# Patient Record
Sex: Female | Born: 1955 | Race: White | Hispanic: No | Marital: Married | Smoking: Never smoker
Health system: Southern US, Community
[De-identification: ages and names within clinical notes are randomized; demographics above are authoritative.]

## PROBLEM LIST (undated history)

## (undated) DIAGNOSIS — R053 Chronic cough: Secondary | ICD-10-CM

## (undated) DIAGNOSIS — M5430 Sciatica, unspecified side: Secondary | ICD-10-CM

## (undated) HISTORY — PX: ABDOMINAL HYSTERECTOMY: SHX81

## (undated) HISTORY — PX: HAND CARPECTOMY: SHX972

## (undated) HISTORY — PX: NASAL SINUS SURGERY: SHX719

---

## 2021-02-17 ENCOUNTER — Encounter: Payer: Self-pay | Admitting: Podiatry

## 2021-02-23 ENCOUNTER — Other Ambulatory Visit (INDEPENDENT_AMBULATORY_CARE_PROVIDER_SITE_OTHER): Payer: Self-pay | Admitting: Podiatry

## 2021-02-23 DIAGNOSIS — I70209 Unspecified atherosclerosis of native arteries of extremities, unspecified extremity: Secondary | ICD-10-CM

## 2021-02-24 ENCOUNTER — Ambulatory Visit (INDEPENDENT_AMBULATORY_CARE_PROVIDER_SITE_OTHER): Payer: Commercial Managed Care - PPO

## 2021-02-24 ENCOUNTER — Encounter (INDEPENDENT_AMBULATORY_CARE_PROVIDER_SITE_OTHER): Payer: Self-pay | Admitting: Nurse Practitioner

## 2021-02-24 ENCOUNTER — Other Ambulatory Visit: Payer: Self-pay

## 2021-02-24 ENCOUNTER — Ambulatory Visit (INDEPENDENT_AMBULATORY_CARE_PROVIDER_SITE_OTHER): Payer: Commercial Managed Care - PPO | Admitting: Nurse Practitioner

## 2021-02-24 VITALS — BP 111/67 | HR 59 | Resp 16 | Ht 64.0 in | Wt 125.8 lb

## 2021-02-24 DIAGNOSIS — R0989 Other specified symptoms and signs involving the circulatory and respiratory systems: Secondary | ICD-10-CM | POA: Diagnosis not present

## 2021-02-24 DIAGNOSIS — I70209 Unspecified atherosclerosis of native arteries of extremities, unspecified extremity: Secondary | ICD-10-CM

## 2021-02-25 ENCOUNTER — Other Ambulatory Visit: Payer: Self-pay | Admitting: Podiatry

## 2021-03-03 ENCOUNTER — Encounter (INDEPENDENT_AMBULATORY_CARE_PROVIDER_SITE_OTHER): Payer: Self-pay | Admitting: Nurse Practitioner

## 2021-03-03 NOTE — Progress Notes (Signed)
Subjective:    Patient ID: Nicole Choi, female    DOB: Feb 10, 1956, 65 y.o.   MRN: [id number] Chief Complaint  Patient presents with  . New Patient (Initial Visit)    Ref consult non-palpable pulses.pre-procedure    Nicole Choi is a 65 year old female that presents today for evaluation by Dr. Excell Seltzer due to an upcoming surgery.  On physical exam was unable to palpate pedal pulses and there was concern for PAD and possible issues with wound healing from surgery.  The patient denies any claudication or rest pain.  There are no wounds or ulcerations.  Today noninvasive studies show an ABI of 1.27 on the right and 1.28 on the left.  The patient has a TBI 0.79 on the right and 0.74 on the left the patient has good strong triphasic tibial artery waveforms bilaterally with good toe waveforms bilaterally.  Review of Systems  Cardiovascular:        No claudication or rest pain  All other systems reviewed and are negative.     Objective:   Physical Exam Vitals reviewed.  HENT:     Head: Normocephalic.  Cardiovascular:     Rate and Rhythm: Normal rate.     Pulses:          Dorsalis pedis pulses are 1+ on the right side and 1+ on the left side.       Posterior tibial pulses are 1+ on the right side and 1+ on the left side.  Pulmonary:     Effort: Pulmonary effort is normal.  Skin:    General: Skin is warm and dry.  Neurological:     Mental Status: She is alert and oriented to person, place, and time.  Psychiatric:        Mood and Affect: Mood normal.        Behavior: Behavior normal.        Thought Content: Thought content normal.        Judgment: Judgment normal.    BP 111/67 (BP Location: Right Arm)   Pulse (!) 59   Resp 16   Ht 5\' 4"  (1.626 m)   Wt 125 lb 12.8 oz (57.1 kg)   BMI 21.59 kg/m   Past Medical History:  Diagnosis Date  . Chronic cough   . Sciatic leg pain     Social History   Socioeconomic History  . Marital status: Married    Spouse name: Not on  file  . Number of children: Not on file  . Years of education: Not on file  . Highest education level: Not on file  Occupational History  . Not on file  Tobacco Use  . Smoking status: Never  . Smokeless tobacco: Never  Substance and Sexual Activity  . Alcohol use: Never  . Drug use: Never  . Sexual activity: Not on file  Other Topics Concern  . Not on file  Social History Narrative  . Not on file   Social Determinants of Health   Financial Resource Strain: Not on file  Food Insecurity: Not on file  Transportation Needs: Not on file  Physical Activity: Not on file  Stress: Not on file  Social Connections: Not on file  Intimate Partner Violence: Not on file    Past Surgical History:  Procedure Laterality Date  . ABDOMINAL HYSTERECTOMY    . HAND CARPECTOMY Bilateral   . NASAL SINUS SURGERY      Family History  Problem Relation Age of Onset  .  Hypertension Mother   . Thyroid disease Mother   . Cancer Father   . Varicose Veins Father   . Varicose Veins Sister   . Thyroid cancer Sister     Allergies  Allergen Reactions  . Levaquin [Levofloxacin] Swelling    No flowsheet data found.    CMP  No results found for: NA, K, CL, CO2, GLUCOSE, BUN, CREATININE, CALCIUM, PROT, ALBUMIN, AST, ALT, ALKPHOS, BILITOT, GFRNONAA, GFRAA   VAS [place] ABI WITH/WO TBI  Result Date: 02/24/2021  LOWER EXTREMITY DOPPLER STUDY Patient Name:  Nicole Choi  Date of Exam:   02/24/2021 Medical Rec #: [id number]        Accession #:    [id number] Date of Birth: 10/23/1955        Patient Gender: F Patient Age:   4 years Exam Location:  [facility] Procedure:      VAS [place] ABI WITH/WO TBI Referring Phys: Rosetta Posner --------------------------------------------------------------------------------  Indications: Non palpable pulses felt at the level of the Ankle.  Performing Technologist: Debbe Bales RVS  Examination Guidelines: A complete evaluation includes at minimum, Doppler  waveform signals and systolic blood pressure reading at the level of bilateral brachial, anterior tibial, and posterior tibial arteries, when vessel segments are accessible. Bilateral testing is considered an integral part of a complete examination. Photoelectric Plethysmograph (PPG) waveforms and toe systolic pressure readings are included as required and additional duplex testing as needed. Limited examinations for reoccurring indications may be performed as noted.  ABI Findings: +---------+------------------+-----+---------+--------+ Right    Rt Pressure (mmHg)IndexWaveform Comment  +---------+------------------+-----+---------+--------+ Brachial 118                                      +---------+------------------+-----+---------+--------+ ATA      138                    triphasic1.14     +---------+------------------+-----+---------+--------+ PTA      154               1.27 triphasic         +---------+------------------+-----+---------+--------+ Great Toe96                0.79 Normal            +---------+------------------+-----+---------+--------+ +---------+------------------+-----+---------+-------+ Left     Lt Pressure (mmHg)IndexWaveform Comment +---------+------------------+-----+---------+-------+ Brachial 121                                     +---------+------------------+-----+---------+-------+ ATA      140                    triphasic1.16    +---------+------------------+-----+---------+-------+ PTA      155               1.28 triphasic        +---------+------------------+-----+---------+-------+ Great Toe89                0.74 Normal           +---------+------------------+-----+---------+-------+ +-------+-----------+-----------+------------+------------+ ABI/TBIToday's ABIToday's TBIPrevious ABIPrevious TBI +-------+-----------+-----------+------------+------------+ Right  1.27       .79                                  +-------+-----------+-----------+------------+------------+  Left   1.28       .74                                 +-------+-----------+-----------+------------+------------+  Summary: Right: Resting right ankle-brachial index is within normal range. No evidence of significant right lower extremity arterial disease. The right toe-brachial index is normal. Left: Resting left ankle-brachial index is within normal range. No evidence of significant left lower extremity arterial disease. The left toe-brachial index is normal.  *See table(s) above for measurements and observations.  Electronically signed by Festus Barren MD on 02/24/2021 at 2:51:47 PM.    Final        Assessment & Plan:   1. Diminished pulses in lower extremity Based on noninvasive studies today the patient has slightly elevated ABIs which may be some evidence of arterial hardening which can explain her hard to palpate pulses.  However noninvasive studies show that the patient has essentially normal blood flow and healing postsurgically should not have any issues.  The patient will follow-up with [place] on an as-needed basis.   Current Outpatient Medications on File Prior to Visit  Medication Sig Dispense Refill  . amitriptyline (ELAVIL) 10 MG tablet Take 20 mg by mouth at bedtime.     No current facility-administered medications on file prior to visit.    There are no Patient Instructions on file for this visit. No follow-ups on file.   Georgiana Spinner, NP

## 2021-03-03 NOTE — Discharge Instructions (Addendum)
[facility]  POST OPERATIVE INSTRUCTIONS FOR DR. Ether Griffins AND DR. Kinza Gouveia [facility] CLIN[facility] PODIATRY DEPARTMENT   Take your medication as prescribed.  Pain medication should be taken only as needed.  You may take pain medication once every 6 hours.  If pain worsens try taking ibuprofen or Tylenol between doses to see if this will help.  If pain is still severe and out of control then would recommend taking 1 pain tablet every 4 hours.  If pain is still out of control after that then try taking 2 pain tablets every 4 hours.  Once again try to take pain medication though as prescribed.  Keep the dressing clean, dry and intact.  Continue nonweightbearing at all times to the left lower extremity.  Keep your foot elevated above the heart level for the first 48 hours.  Continue elevation thereafter for further swelling control.  You may also put ice to the anterior aspect of your ankle/top of the foot for maximum 10 minutes out of every 1 hour to help with swelling.  Take antibiotics as prescribed until gone.  Also start taking aspirin 81 mg twice daily starting tomorrow.  Always use crutches, knee scooter, or wheelchair to get around.  Do not take a shower. Baths are permissible as long as the foot is kept out of the water.  Do not get dressing wet.  Every hour you are awake:  Bend your knee 15 times. Massage calf 15 times  Call [facility] [phone]) if any of the following problems occur: You develop a temperature or fever. The bandage becomes saturated with blood. Medication does not stop your pain. Injury of the foot occurs. Any symptoms of infection including redness, odor, or red streaks running from wound.    PERIPHERAL NERVE BLOCK PATIENT INFORMATION  Your surgeon has requested a peripheral nerve block for your surgery. This anesthetic technique provides excellent post-operative pain relief for you in a safe and effective manner. It  will also help reduce the risk of nausea and vomiting and allow earlier discharge from the hospital.   The block is performed under sedation with ultrasound guidance prior to your procedure. Due to the sedation, your may or may not remember the block experience. The nerve block will begin to take effect anywhere from 5 to 30 minutes after being administered. You will be transported to the operating room from your surgery after the block is completed.   At the end of surgery, when the anesthesia wears off, you will notice a few things. Your may not be able to move or feel the part of your body targeted by the nerve block. These are normal experiences, and they will disappear as the block wears off.  If you had an interscalene nerve block performed (which is common for shoulder surgery), your voice can be very hoarse and you may feel that you are not able to take as deep a breath as you did before surgery. Some patients may also notice a droopy eyelid on the affected side. These symptoms will resolve once the block wears off.  Pain control: The nerve block technique used is a single injection that can last anywhere from 1-3 days. The duration of the numbness can vary between individuals. After leaving the hospital, it is important that you begin to take your prescribed pain medication when you start to sense the nerve block wearing off. This will help you avoid unpleasant pain at the time the nerve block wears off,  which can sometimes be in the middle of the night. The block will only cover pain in the areas targeted by the nerve block so if you experience surgical pain outside of that area, please take your prescribed pain medication. Management of the "numb area": After a nerve block, you cannot feel pain, pressure, or temperature in the affected area so there is an increased risk for injury. You should take extra care to protect the affected areas until sensation and movement returns. Please take caution to  not come in contact with extremely hot or cold items because you will not be able to sense or protect yourself form the extremes of temperature.  You may experience some persistent numbness after the procedure by most neurological deficits resolve over time and the incidence of serious long term neurological complications attributable to peripheral nerve blocks are relatively uncommon.     Information for Discharge Teaching: EXPAREL (bupivacaine liposome injectable suspension)   Your surgeon or anesthesiologist gave you EXPAREL(bupivacaine) to help control your pain after surgery.  EXPAREL is a local anesthetic that provides pain relief by numbing the tissue around the surgical site. EXPAREL is designed to release pain medication over time and can control pain for up to 72 hours. Depending on how you respond to EXPAREL, you may require less pain medication during your recovery.  Possible side effects: Temporary loss of sensation or ability to move in the area where bupivacaine was injected. Nausea, vomiting, constipation Rarely, numbness and tingling in your mouth or lips, lightheadedness, or anxiety may occur. Call your doctor right away if you think you may be experiencing any of these sensations, or if you have other questions regarding possible side effects.  Follow all other discharge instructions given to you by your surgeon or nurse. Eat a healthy diet and drink plenty of water or other fluids.  If you return to the hospital for any reason within 96 hours following the administration of EXPAREL, it is important for health care providers to know that you have received this anesthetic. A teal colored band has been placed on your arm with the date, time and amount of EXPAREL you have received in order to alert and inform your health care providers. Please leave this armband in place for the full 96 hours following administration, and then you may remove the band.

## 2021-03-05 ENCOUNTER — Other Ambulatory Visit: Payer: Self-pay

## 2021-03-05 ENCOUNTER — Encounter
Admission: RE | Disposition: A | Discharge status: Home / Self Care | Payer: Self-pay | Source: Ambulatory Visit | Attending: Podiatry

## 2021-03-05 ENCOUNTER — Ambulatory Visit: Payer: Commercial Managed Care - PPO | Admitting: Anesthesiology

## 2021-03-05 ENCOUNTER — Ambulatory Visit
Admission: RE | Admit: 2021-03-05 | Discharge: 2021-03-05 | Disposition: A | Discharge status: Home / Self Care | Payer: Commercial Managed Care - PPO | Source: Ambulatory Visit | Attending: Podiatry | Admitting: Podiatry

## 2021-03-05 ENCOUNTER — Encounter: Payer: Self-pay | Admitting: Podiatry

## 2021-03-05 DIAGNOSIS — M21621 Bunionette of right foot: Secondary | ICD-10-CM | POA: Insufficient documentation

## 2021-03-05 DIAGNOSIS — Z09 Encounter for follow-up examination after completed treatment for conditions other than malignant neoplasm: Secondary | ICD-10-CM

## 2021-03-05 DIAGNOSIS — Z881 Allergy status to other antibiotic agents status: Secondary | ICD-10-CM | POA: Insufficient documentation

## 2021-03-05 DIAGNOSIS — M2011 Hallux valgus (acquired), right foot: Secondary | ICD-10-CM | POA: Insufficient documentation

## 2021-03-05 DIAGNOSIS — M2041 Other hammer toe(s) (acquired), right foot: Secondary | ICD-10-CM | POA: Diagnosis not present

## 2021-03-05 DIAGNOSIS — Z79899 Other long term (current) drug therapy: Secondary | ICD-10-CM | POA: Diagnosis not present

## 2021-03-05 HISTORY — PX: OSTECTOMY: SHX6439

## 2021-03-05 HISTORY — DX: Chronic cough: R05.3

## 2021-03-05 HISTORY — DX: Sciatica, unspecified side: M54.30

## 2021-03-05 HISTORY — PX: CORRECTION OVERLAPPING TOES: SHX6615

## 2021-03-05 HISTORY — PX: CAPSULOTOMY: SHX379

## 2021-03-05 HISTORY — PX: BUNIONECTOMY: SHX129

## 2021-03-05 HISTORY — PX: WEIL OSTEOTOMY: SHX5044

## 2021-03-05 SURGERY — BUNIONECTOMY
Anesthesia: Regional | Site: Toe | Laterality: Right

## 2021-03-05 MED ORDER — EPHEDRINE SULFATE 50 MG/ML IJ SOLN
INTRAMUSCULAR | Status: DC | PRN
  Administered 2021-03-05: 10 mg via INTRAVENOUS

## 2021-03-05 MED ORDER — LACTATED RINGERS IV SOLN: INTRAVENOUS | Status: DC

## 2021-03-05 MED ORDER — OXYCODONE HCL 5 MG PO TABS: 5.0000 mg | ORAL_TABLET | Freq: Once | ORAL | Status: DC | PRN

## 2021-03-05 MED ORDER — LIDOCAINE HCL (CARDIAC) PF 100 MG/5ML IV SOSY
PREFILLED_SYRINGE | INTRAVENOUS | Status: DC | PRN
  Administered 2021-03-05: 30 mg via INTRATRACHEAL

## 2021-03-05 MED ORDER — PROPOFOL 10 MG/ML IV BOLUS
INTRAVENOUS | Status: DC | PRN
  Administered 2021-03-05: 130 mg via INTRAVENOUS

## 2021-03-05 MED ORDER — DEXAMETHASONE SODIUM PHOSPHATE 4 MG/ML IJ SOLN
INTRAMUSCULAR | Status: DC | PRN
  Administered 2021-03-05: 4 mg via INTRAVENOUS

## 2021-03-05 MED ORDER — ACETAMINOPHEN 325 MG PO TABS: 325.0000 mg | ORAL_TABLET | ORAL | Status: DC | PRN

## 2021-03-05 MED ORDER — BUPIVACAINE HCL (PF) 0.5 % IJ SOLN
INTRAMUSCULAR | Status: DC | PRN
  Administered 2021-03-05: 20 mL via PERINEURAL

## 2021-03-05 MED ORDER — ONDANSETRON HCL 4 MG/2ML IJ SOLN
INTRAMUSCULAR | Status: DC | PRN
  Administered 2021-03-05: 4 mg via INTRAVENOUS

## 2021-03-05 MED ORDER — ACETAMINOPHEN 160 MG/5ML PO SOLN: 325.0000 mg | ORAL | Status: DC | PRN

## 2021-03-05 MED ORDER — OXYCODONE HCL 5 MG/5ML PO SOLN: 5.0000 mg | Freq: Once | ORAL | Status: DC | PRN

## 2021-03-05 MED ORDER — POVIDONE-IODINE 7.5 % EX SOLN: Freq: Once | CUTANEOUS | Status: AC

## 2021-03-05 MED ORDER — MIDAZOLAM HCL 5 MG/5ML IJ SOLN
INTRAMUSCULAR | Status: DC | PRN
  Administered 2021-03-05: 2 mg via INTRAVENOUS

## 2021-03-05 MED ORDER — 0.9 % SODIUM CHLORIDE (POUR BTL) OPTIME
TOPICAL | Status: DC | PRN
  Administered 2021-03-05: 500 mL

## 2021-03-05 MED ORDER — ASPIRIN EC 81 MG PO TBEC: 81.0000 mg | DELAYED_RELEASE_TABLET | Freq: Two times a day (BID) | ORAL | 0 refills | Status: AC

## 2021-03-05 MED ORDER — CEFAZOLIN SODIUM-DEXTROSE 2-4 GM/100ML-% IV SOLN
2.0000 g | INTRAVENOUS | Status: AC
  Administered 2021-03-05: 2 g via INTRAVENOUS

## 2021-03-05 MED ORDER — BUPIVACAINE LIPOSOME 1.3 % IJ SUSP
INTRAMUSCULAR | Status: DC | PRN
  Administered 2021-03-05: 20 mL via PERINEURAL

## 2021-03-05 MED ORDER — OXYCODONE-ACETAMINOPHEN 7.5-325 MG PO TABS: 1.0000 | ORAL_TABLET | Freq: Four times a day (QID) | ORAL | 0 refills | Status: AC | PRN

## 2021-03-05 MED ORDER — FENTANYL CITRATE PF 50 MCG/ML IJ SOSY: 25.0000 ug | PREFILLED_SYRINGE | INTRAMUSCULAR | Status: DC | PRN

## 2021-03-05 MED ORDER — FENTANYL CITRATE (PF) 100 MCG/2ML IJ SOLN
INTRAMUSCULAR | Status: DC | PRN
  Administered 2021-03-05: 100 ug via INTRAVENOUS

## 2021-03-05 MED ORDER — AMOXICILLIN-POT CLAVULANATE 875-125 MG PO TABS: 1.0000 | ORAL_TABLET | Freq: Two times a day (BID) | ORAL | 0 refills | Status: AC

## 2021-03-05 SURGICAL SUPPLY — 68 items
BIT DRILL 1.7 LNG CANN (DRILL) ×3 IMPLANT
BLADE OSC/SAGITTAL MD 5.5X18 (BLADE) ×3 IMPLANT
BLADE OSCILLATING/SAGITTAL (BLADE) ×3
BLADE SAW LAPIPLASTY 40X11 (BLADE) ×3 IMPLANT
BLADE SURG 15 STRL LF DISP TIS (BLADE) ×4 IMPLANT
BLADE SURG 15 STRL SS (BLADE) ×6
BLADE SW THK.38XMED LNG THN (BLADE) ×2 IMPLANT
BNDG CMPR 75X41 PLY HI ABS (GAUZE/BANDAGES/DRESSINGS) ×2
BNDG CMPR STD VLCR NS LF 5.8X4 (GAUZE/BANDAGES/DRESSINGS) ×2
BNDG CMPR STD VLCR NS LF 5.8X6 (GAUZE/BANDAGES/DRESSINGS) ×2
BNDG ELASTIC 4X5.8 VLCR NS LF (GAUZE/BANDAGES/DRESSINGS) ×3 IMPLANT
BNDG ELASTIC 6X5.8 VLCR NS LF (GAUZE/BANDAGES/DRESSINGS) ×3 IMPLANT
BNDG ESMARK 4X12 TAN STRL LF (GAUZE/BANDAGES/DRESSINGS) ×3 IMPLANT
BNDG GAUZE ELAST 4 BULKY (GAUZE/BANDAGES/DRESSINGS) ×3 IMPLANT
BNDG STRETCH 4X75 STRL LF (GAUZE/BANDAGES/DRESSINGS) ×3 IMPLANT
CANISTER SUCT 1200ML W/VALVE (MISCELLANEOUS) ×3 IMPLANT
CLIP FIXATION STAPLE 10X10X10 (Staple) ×3 IMPLANT
CNTRSNK DRL 2 SCR (MISCELLANEOUS) ×2 IMPLANT
COUNTERSINK 2.0 (MISCELLANEOUS) ×3
COVER LIGHT HANDLE UNIVERSAL (MISCELLANEOUS) ×6 IMPLANT
CUFF TOURN SGL QUICK 18X4 (TOURNIQUET CUFF) IMPLANT
DRAPE FLUOR MINI C-ARM 54X84 (DRAPES) ×3 IMPLANT
DURAPREP 26ML APPLICATOR (WOUND CARE) ×3 IMPLANT
ELECT REM PT RETURN 9FT ADLT (ELECTROSURGICAL) ×3
ELECTRODE REM PT RTRN 9FT ADLT (ELECTROSURGICAL) ×2 IMPLANT
GAUZE SPONGE 4X4 12PLY STRL (GAUZE/BANDAGES/DRESSINGS) ×3 IMPLANT
GAUZE XEROFORM 1X8 LF (GAUZE/BANDAGES/DRESSINGS) ×6 IMPLANT
GLOVE SURG ENC MOIS LTX SZ7 (GLOVE) ×3 IMPLANT
GLOVE SURG POLYISO LF SZ7 (GLOVE) ×3 IMPLANT
GLOVE SURG UNDER POLY LF SZ7.5 (GLOVE) ×6 IMPLANT
GOWN STRL REUS W/ TWL LRG LVL3 (GOWN DISPOSABLE) ×4 IMPLANT
GOWN STRL REUS W/ TWL XL LVL3 (GOWN DISPOSABLE) ×2 IMPLANT
GOWN STRL REUS W/TWL LRG LVL3 (GOWN DISPOSABLE) ×6
GOWN STRL REUS W/TWL XL LVL3 (GOWN DISPOSABLE) ×3
K-WIRE DBL END TROCAR 6X.045 (WIRE) ×3
KIT PROCEDURE DRILL (DRILL) ×3 IMPLANT
KIT TURNOVER KIT A (KITS) ×3 IMPLANT
KWIRE DBL END TROCAR 6X.045 (WIRE) ×2 IMPLANT
LAPIPLASTY SYS 4A (Orthopedic Implant) ×3 IMPLANT
NS IRRIG 500ML POUR BTL (IV SOLUTION) ×3 IMPLANT
PACK EXTREMITY ARMC (MISCELLANEOUS) ×3 IMPLANT
PADDING CAST BLEND 4X4 NS (MISCELLANEOUS) ×12 IMPLANT
SCREW 2.0X11 HEADED (Screw) ×3 IMPLANT
SCREW 2.7 HIGH PITCH LOCKING (Screw) ×3 IMPLANT
SCREW CANN HEAD ST 2.0X10 (Screw) ×3 IMPLANT
SCREW HEAD ST 12X2XHD NS (Screw) ×4 IMPLANT
SCREW HEADED 2.0X12 (Screw) ×6 IMPLANT
SCREW HIGH PITCH LOCK 2.7 (Screw) ×3 IMPLANT
SPLINT CAST 1 STEP 4X30 (MISCELLANEOUS) IMPLANT
STOCKINETTE STRL 6IN 960660 (GAUZE/BANDAGES/DRESSINGS) ×3 IMPLANT
SUT ETHILON 4-0 (SUTURE) ×9
SUT ETHILON 4-0 FS2 18XMFL BLK (SUTURE) ×6
SUT MNCRL 4-0 (SUTURE) ×3
SUT MNCRL 4-0 27XMFL (SUTURE) ×2
SUT MNCRL+ 5-0 UNDYED PC-3 (SUTURE) IMPLANT
SUT MONOCRYL 5-0 (SUTURE)
SUT VIC AB 0 CT1 27 (SUTURE)
SUT VIC AB 0 CT1 27XCR 8 STRN (SUTURE) IMPLANT
SUT VIC AB 2-0 SH 27 (SUTURE)
SUT VIC AB 2-0 SH 27XBRD (SUTURE) IMPLANT
SUT VIC AB 3-0 SH 27 (SUTURE) ×6
SUT VIC AB 3-0 SH 27X BRD (SUTURE) ×4 IMPLANT
SUT VIC AB 4-0 FS2 27 (SUTURE) IMPLANT
SUTURE ETHLN 4-0 FS2 18XMF BLK (SUTURE) ×6 IMPLANT
SUTURE MNCRL 4-0 27XMF (SUTURE) ×2 IMPLANT
SYSTEM LAPIPLASTY 4A (Orthopedic Implant) ×2 IMPLANT
WATER STERILE IRR 250ML POUR (IV SOLUTION) ×3 IMPLANT
WIRE SMOOTH TROCAR .9MMX150MML (WIRE) ×12 IMPLANT

## 2021-03-05 NOTE — Anesthesia Postprocedure Evaluation (Signed)
Anesthesia Post Note  Patient: Nicole Choi  Procedure(s) Performed: BUNIONECTOMY-lapidus akin (Right: Toe) OSTECTOMY-5th met head (Right) WEIL OSTEOTOMY 2nd right (Right) CORRECTION OVERLAPPING TOES  reconstruction rt 2nd (Right) CAPSULOTOMY rt 3rd mtpj (Right)     Patient location during evaluation: PACU Anesthesia Type: Regional Level of consciousness: awake Pain management: pain level controlled Vital Signs Assessment: post-procedure vital signs reviewed and stable Respiratory status: respiratory function stable Cardiovascular status: stable Postop Assessment: no signs of nausea or vomiting Anesthetic complications: no   No notable events documented.  Veda Canning

## 2021-03-05 NOTE — Anesthesia Procedure Notes (Signed)
Anesthesia Regional Block: Adductor canal block   Pre-Anesthetic Checklist: , timeout performed,  Correct Patient, Correct Site, Correct Laterality,  Correct Procedure, Correct Position, site marked,  Risks and benefits discussed,  Surgical consent,  Pre-op evaluation,  At surgeon's request and post-op pain management  Laterality: Right  Prep: chloraprep       Needles:  Injection technique: Single-shot  Needle Type: Echogenic Needle     Needle Length: 9cm  Needle Gauge: 21     Additional Needles:   Procedures:,,,, ultrasound used (permanent image in chart),,    Narrative:  Start time: 03/05/2021 8:37 AM End time: 03/05/2021 8:39 AM Injection made incrementally with aspirations every 5 mL.  Performed by: Personally  Anesthesiologist: Jola Babinski, MD  Additional Notes: Functioning IV was confirmed and monitors applied. Ultrasound guidance: relevant anatomy identified, needle position confirmed, local anesthetic spread visualized around nerve(s)., vascular puncture avoided.  Image printed for medical record.  Negative aspiration and no paresthesias; incremental administration of local anesthetic. The patient tolerated the procedure well. Vitals signs recorded in RN notes.  24mL 0.5% bupivacaine + 38mL Exparel for total of 110mL local in adductor block

## 2021-03-05 NOTE — H&P (Signed)
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  The various methods of treatment have been discussed with the patient.  No guarantees were given.  After consideration of risks, benefits and other options for treatment, the patient has consented to surgery.  I have reviewed the patients' chart and labs.     A history and physical examination was performed in my office.  The patient was reexamined.  There have been no changes to this history and physical examination.  Rosetta Posner, DPM

## 2021-03-05 NOTE — Op Note (Signed)
PODIATRY / FOOT AND ANKLE SURGERY OPERATIVE REPORT    SURGEON: Rosetta Posner, DPM  PRE-OPERATIVE DIAGNOSIS: All right foot 1.  Hallux valgus with first TMT J hypermobility and dorsiflexed first ray 2.  Second hammertoe contracture 3.  Hallux limitus 4.  Long plantarflexed second metatarsal. 5.  Extensor contractures digits 2 through 5 6.  Tailor's bunion  POST-OPERATIVE DIAGNOSIS: Same  PROCEDURE(S): All right foot Lapidus bunionectomy with Akin osteotomy Cheilectomy first metatarsal phalangeal joint with fenestration of mild cartilage damage. Second metatarsal Weil osteotomy Capsular tendon balancing second, third metatarsal phalangeal joints with extensor tendon lengthenings Tailor's bunionectomy with osteotomy  HEMOSTASIS: Right ankle tourniquet  ANESTHESIA: general  ESTIMATED BLOOD LOSS: 40 cc  FINDING(S): 1.  Cartilage damage noted to the first metatarsal head at the dorsal aspect and very plantar aspect 2.  Extensor contractures of digits 2, 3, 4, 5  PATHOLOGY/SPECIMEN(S): None  INDICATIONS:   Nicole Choi is a 65 y.o. female who presents with painful bunion to the first metatarsal phalangeal joint.  Patient also complains of an elevated second toe as it rubs against the shoe.  Patient also notes that the first and second toes rub against each other due to the bunion deformity present.  Patient also complains of a bunion deformity to the fifth metatarsal phalangeal joints and overall wide foot type.  Patient has pain with ambulation.  Patient has exhausted conservative measures consisting of wider shoes, orthoses, padding, strapping, taping, toe pads.  All treatment options were discussed with patient both conservative and surgical attempts at correction including potential risks and complications at this time patient has elected for surgical intervention described above.  No guarantees given.  DESCRIPTION: After obtaining full informed written consent, the patient was  brought back to the operating room and placed supine upon the operating table.  The patient received IV antibiotics prior to induction.  After obtaining adequate anesthesia, the patient was prepped and draped in the standard fashion.  A preoperative saphenous and popliteal block was performed by anesthesia.  An Esmarch bandage was used to exsanguinate the right lower extremity and pneumatic ankle tourniquet was inflated.  Attention was directed to the dorsal aspect of the first tarsal metatarsal near where a linear longitudinal incision was made over this area.  Another incision was then made over the dorsal medial aspect of the first metatarsal phalangeal joint.  These incisions were deepened through the subcutaneous tissues utilizing sharp and blunt dissection care was taken to identify and retract all vital neurovascular structures and all venous contributories were cauterized as necessary.  At this time an incision was made medial to the tendon of the extensor hallucis longus at the level of first tarsometatarsal joint and a capsular incision was made into this area.  The capsular and periosteal tissue was reflected medially and laterally thereby exposing the first tarsometatarsal joint at the operative site.  A osteotome was placed into the joint site and the joint was opened further releasing all ligamentous structures connected to the area in all directions.  Similarly at the first metatarsal phalangeal joints a incision was made along the contour of the deformity medial to the tendon of the extensor hallucis longus.  The incision was made to capsule and periosteum.  The capsular and periosteal tissue was reflected medially and laterally thereby exposing the first metatarsal phalangeal joint at the operative site.  The incision was carried out distal such that was over the proximal phalanx as well slightly proximal to the IPJ.  The  periosteum and capsular tissue was also reflected medially and laterally  at this area as well in preparation for the Akin osteotomy later on.  At this time a lateral release was then performed through the first metatarsal phalangeal joint incision releasing the conjoined tendon of the adductor hallucis as well as any tight lateral ligamentous structures.  The medial eminence was then resected and passed off in the operative site.  There also appeared to be a large dorsal exostosis present at the level of the joint which was also resected at the first metatarsal head and base the proximal phalanx.  This was performed with a sagittal bone saw but also performed with the rongeur.  There also appeared to be mild cartilage damage present at the dorsal aspect of the first metatarsal phalangeal joint as well as the plantar medial aspect.  A 0.045 K wire was then used to microfracture this area and attempt to obtain some fibrocartilage growth to the joint.  Attention was then directed to the first tarsometatarsal joint area where a sagittal bone saw was ran through the joint creating a smooth contour.  The Lapiplasty system was then set up and a pin was placed into the first metatarsal base to rotate the metatarsal.  Correction was then obtained reducing the intermetatarsal angle as well as the rotating first metatarsal into a rectus position.  This appeared to look excellent overall and the first metatarsal was also plantarflexed to obtain adequate reduction.  The jig system was then applied.  A small stab incision was made over the lateral aspect of the midshaft portion of the second metatarsal.  Blunt dissection was continued down to the lateral surface of the metatarsal.  After everything was all set up the sagittal bone saw was used to resect the joint surface at the first tarsometatarsal joint.  This articular cartilage pieces were removed and passed off in the operative site.  A fenestrating drill bit was then used to fenestrate the joint surface at the first tarsometatarsal joint.  The  compressor was then applied to compress the joint.  Once this was obtained temporary crossing wire fixation was obtained.  After this was obtained all of the jig system except for the fulcrum was removed.  The dorsal 4-hole locking plate was then placed with the appropriate 4 screws and the medial plate was then placed as well with 4 corresponding screws the appropriate length and size.  The foot was then stressed under fluoroscopic guidance and there did not appear to be any gapping at the intercuneiform joint level indicating no instability.  The temporary wires were removed and passed off the operative site.  The first intermetatarsal space angle appeared to be well reduced overall with sesamoids directly underneath the first metatarsal head.  The joint also appeared to move well at the first metatarsal phalangeal joint.  Excellent plantarflexion of the first metatarsal was noted compared to preoperative imaging and appeared to be in line with the rest the foot.  The big toe did still appear to be hitting the second toe slightly so a medial capsulorrhaphy was performed after a flush was performed with copious amounts normal sterile saline.  A wedge was taken out from the medial aspect of the first metatarsal phalangeal joint capsule and the toe was held in a rectus position while 3-0 Vicryl was used to repair the capsule medially.  There appeared to still be a slight contracture at the IPJ hallux in which the distal aspect of the toe was  touching the second toe.  At this time it was determined to perform an Akin osteotomy.  An Akin osteotomy was performed at the mid portion of the proximal phalanx after a guidewire was placed for cutting aid.  A laterally based wedge was taken out.  The toe was then pushed into a medial direction thereby closing the wedge down.  A Paragon 2810 mm staple was then used to compress the osteotomy site.  All incision sites were flushed with copious amounts normal sterile saline.  The  periosteal and capsular structures were reapproximated well coapted with 3-0 Vicryl.  The subcutaneous tissues were reapproximated well coapted with 4-0 Monocryl.  The skin was then reapproximated well coapted with 3-0 nylon in a combination of simple and horizontal mattress type stitching.  The pneumatic ankle tourniquet was deflated throughout this process.  An Esmarch bandage was used to exsanguinate the right lower extremity and the pneumatic ankle tourniquet was once again inflated after about 15 to 20 minutes without the tourniquet being up.  An incision was made over the second metatarsal phalangeal joint.  The incision was deepened through the subcutaneous tissues utilizing sharp and blunt dissection care was taken to identify and retract all vital neurovascular structures and all venous contributories were cauterized as necessary.  At this time a capsular incision was made medial to the tendon of the extensor hallucis longus.  The capsular and periosteal tissue was reflected medially and laterally thereby exposing the second metatarsal phalangeal joint at the operative site.  At this time a distal second metatarsal Weil osteotomy was performed.  The capital fragment was shifted proximally approximately 4 to 5 mm and held in place with temporary fixation with 2 guidewires.  The parabola appeared to be of the appropriate length under fluoroscopic guidance.  2 Paragon 28 2.0 cannulated screws then placed from dorsal to plantar across the osteotomy site with excellent compression.  The guidewires were removed.  The dorsal overhang was then resected with a rongeur.  The second toe overall surprisingly appeared to sit in a very rectus position with the foot loaded.  The third toe though did appear to be abutting up into the second toe and appeared to have an extensor contracture as well as the fourth and fifth toes compared to the first and second toes.  At this time blunt dissection was continued down to the  medial capsule of the third metatarsal phalangeal joint where a joint release was performed over the dorsal and medial aspects.  An extensor tendon lengthening was also performed in this area.  The first, second, third toes appeared to sit in a very rectus position overall with the foot loaded and not loaded.  Attention was then directed to the dorsal lateral aspect of the fifth metatarsal phalangeal joint where a linear longitudinal incision was made over the contour of this area lateral to the tendon of the extensor digitorum longus.  The incision was deepened through the subcutaneous tissues utilizing sharp and blunt dissection care was taken to identify and retract all vital neurovascular structures all venous contributories were cauterized as necessary.  A periosteal and capsular incision was made lateral to the tendon of the extensor digitorum longus and the periosteal and capsular tissues were reflected medially and laterally thereby exposing the fifth metatarsal head at the operative site.  At this time a sagittal bone saw was used to resect the lateral eminence.  A distal fifth metatarsal Weil type osteotomy was performed creating a slightly longer plantar capital  fragment with the appropriate orientation.  Once this through and through osteotomy was performed the distal fragment was manipulated to reduce the intermetatarsal space angle as well as lateral deviation angle to sit in a rectus position overall.  Once adequate reduction was obtained 2 guidewires were then placed from dorsal plantar across the osteotomy site.  This was checked once again under fluoroscopic guidance and noted to be in excellent position overall.  At this time 2  2.0 cannulated Paragon 28 screws were placed across the osteotomy sites with excellent compression noted.  The guidewires were removed and passed off the operative site.  The lateral overhang was then resected also and passed off the operative site and smoothed to a  normal contour.  All remaining surgical sites were flushed with copious amounts normal sterile saline.  The periosteal and capsular structures were reapproximated well coapted with 3-0 Vicryl.  The subcutaneous tissue was reapproximated well coapted with 4-0 Monocryl.  The skin was then reapproximated well coapted with 3-0 nylon.  The pneumatic ankle tourniquet was deflated and a prompt hyperemic response was noted all digits of the right foot.  A postoperative dressing was then applied consisting of Xeroform to the all incision lines followed by 4 x 4 gauze splinting the toes and to rectus positions, Kling, Kerlix, web roll, posterior splint, Ace wrap.  The patient tolerated the procedure and anesthesia well and strain to the recovery room vital signs stable vascular status intact all toes of the right foot.  Patient will be discharged home with the appropriate orders and follow-up instructions.  Patient is to remain nonweightbearing to the right lower extremity at all times.  COMPLICATIONS: None  CONDITION: Good, stable  Rosetta Posner, DPM

## 2021-03-05 NOTE — Anesthesia Preprocedure Evaluation (Addendum)
Anesthesia Evaluation  Patient identified by MRN, date of birth, ID band Patient awake    Reviewed: Allergy & Precautions, NPO status   Airway Mallampati: I  TM Distance: >3 FB     Dental   Pulmonary  Chronic sinusitis, sinus congestion, hx bronchitis. Last saw pulmonology on 9/1 and CXR was clear then.   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    GI/Hepatic negative GI ROS,   Endo/Other    Renal/GU      Musculoskeletal   Abdominal   Peds  Hematology   Anesthesia Other Findings   Reproductive/Obstetrics                            Anesthesia Physical Anesthesia Plan  ASA: 2  Anesthesia Plan: General and Regional   Post-op Pain Management:  Regional for Post-op pain   Induction: Intravenous  PONV Risk Score and Plan: Treatment may vary due to age or medical condition  Airway Management Planned: LMA  Additional Equipment:   Intra-op Plan:   Post-operative Plan:   Informed Consent: I have reviewed the patients History and Physical, chart, labs and discussed the procedure including the risks, benefits and alternatives for the proposed anesthesia with the patient or authorized representative who has indicated his/her understanding and acceptance.     Dental advisory given  Plan Discussed with: CRNA  Anesthesia Plan Comments:        Anesthesia Quick Evaluation

## 2021-03-05 NOTE — Anesthesia Procedure Notes (Signed)
Anesthesia Regional Block: Popliteal block   Pre-Anesthetic Checklist: , timeout performed,  Correct Patient, Correct Site, Correct Laterality,  Correct Procedure, Correct Position, site marked,  Risks and benefits discussed,  Surgical consent,  Pre-op evaluation,  At surgeon's request and post-op pain management  Laterality: Right  Prep: chloraprep       Needles:  Injection technique: Single-shot  Needle Type: Echogenic Needle     Needle Length: 9cm  Needle Gauge: 21     Additional Needles:   Procedures:,,,, ultrasound used (permanent image in chart),,    Narrative:  Start time: 03/05/2021 8:30 AM End time: 03/05/2021 6:35 PM Injection made incrementally with aspirations every 5 mL.  Performed by: Personally  Anesthesiologist: Jola Babinski, MD  Additional Notes: Functioning IV was confirmed and monitors applied. Ultrasound guidance: relevant anatomy identified, needle position confirmed, local anesthetic spread visualized around nerve(s)., vascular puncture avoided.  Image printed for medical record.  Negative aspiration and no paresthesias; incremental administration of local anesthetic. The patient tolerated the procedure well. Vitals signs recorded in RN notes.  70mL 0.5% bupivacaine + 50mL Exparel for total of 73mL in popliteal block

## 2021-03-05 NOTE — Transfer of Care (Signed)
Immediate Anesthesia Transfer of Care Note  Patient: Nicole Choi  Procedure(s) Performed: BUNIONECTOMY-lapidus akin (Right: Toe) OSTECTOMY-5th met head (Right) WEIL OSTEOTOMY 2nd right (Right) CORRECTION OVERLAPPING TOES  reconstruction rt 2nd (Right) CAPSULOTOMY rt 3rd mtpj (Right)  Patient Location: PACU  Anesthesia Type: General, Regional  Level of Consciousness: awake, alert  and patient cooperative  Airway and Oxygen Therapy: Patient Spontanous Breathing and Patient connected to supplemental oxygen  Post-op Assessment: Post-op Vital signs reviewed, Patient's Cardiovascular Status Stable, Respiratory Function Stable, Patent Airway and No signs of Nausea or vomiting  Post-op Vital Signs: Reviewed and stable  Complications: No notable events documented.

## 2021-03-05 NOTE — Progress Notes (Signed)
Assisted Jola Babinski, ANMD  with right, ultrasound guided, popliteal, adductor canal block. Side rails up, monitors on throughout procedure. See vital signs in flow sheet. Tolerated Procedure well.

## 2021-03-05 NOTE — Anesthesia Procedure Notes (Signed)
Procedure Name: LMA Insertion Date/Time: 03/05/2021 9:30 AM Performed by: Maree Krabbe, CRNA Pre-anesthesia Checklist: Patient identified, Emergency Drugs available, Suction available, Timeout performed and Patient being monitored Patient Re-evaluated:Patient Re-evaluated prior to induction Oxygen Delivery Method: Circle system utilized Preoxygenation: Pre-oxygenation with 100% oxygen Induction Type: IV induction LMA: LMA inserted LMA Size: 4.0 Number of attempts: 1 Placement Confirmation: positive ETCO2 and breath sounds checked- equal and bilateral Tube secured with: Tape Dental Injury: Teeth and Oropharynx as per pre-operative assessment

## 2021-03-06 ENCOUNTER — Encounter: Payer: Self-pay | Admitting: Podiatry

## 2021-05-19 ENCOUNTER — Other Ambulatory Visit: Payer: Self-pay | Admitting: Podiatry

## 2021-05-19 ENCOUNTER — Other Ambulatory Visit: Payer: Self-pay

## 2021-05-19 ENCOUNTER — Ambulatory Visit
Admission: RE | Admit: 2021-05-19 | Discharge: 2021-05-19 | Disposition: A | Discharge status: Home / Self Care | Payer: Commercial Managed Care - PPO | Source: Ambulatory Visit | Attending: Podiatry | Admitting: Podiatry

## 2021-05-19 DIAGNOSIS — M7989 Other specified soft tissue disorders: Secondary | ICD-10-CM | POA: Diagnosis present

## 2021-07-22 ENCOUNTER — Other Ambulatory Visit: Payer: Self-pay | Admitting: Podiatry

## 2021-07-23 ENCOUNTER — Encounter: Payer: Self-pay | Admitting: Podiatry

## 2021-07-28 NOTE — Discharge Instructions (Addendum)
[facility] ?[facility] ? ?POST OPERATIVE INSTRUCTIONS FOR DR. Ether Griffins AND DR. Taysom Glymph ?[facility] CLIN[facility] PODIATRY DEPARTMENT ? ? ?Take your medication as prescribed.  Pain medication should be taken only as needed. ? ?Keep the dressing clean, dry and intact. ? ?Keep your foot elevated above the heart level for the first 48 hours. ? ?Walking to the bathroom and brief periods of walking are acceptable, unless we have instructed you to be non-weight bearing. ? ?Always wear your post-op shoe when walking.  Always use your crutches if you are to be non-weight bearing. ? ?Do not take a shower. Baths are permissible as long as the foot is kept out of the water.  ? ?Every hour you are awake:  ?Bend your knee 15 times. ?Flex foot 15 times ?Massage calf 15 times ? ?Call [facility] [phone]) if any of the following problems occur: ?You develop a temperature or fever. ?The bandage becomes saturated with blood. ?Medication does not stop your pain. ?Injury of the foot occurs. ?Any symptoms of infection including redness, odor, or red streaks running from wound. [facility] ?[facility] ? ?POST OPERATIVE INSTRUCTIONS FOR DR. Ether Griffins AND DR. Rena Hunke ?[facility] CLINIC PODIATRY DEPARTMENT ? ? ?Take your medication as prescribed.  Pain medication should be taken only as needed.  May supplement with ibuprofen or tylenol. ? ?Keep the dressing clean, dry and intact.  Remain nonweightbearing at all times to left foot.  Partial weightbearing try to put mostly pressure on your heel on the left foot in surgical shoe. ? ?Keep your foot elevated above the heart level for the first 48 hours.  Continue elevation and ice as needed thereafter to improve swelling/pain ? ?Walking to the bathroom and brief periods of walking are acceptable, unless we have instructed you to be non-weight bearing. ? ?Always wear your post-op shoe when walking.  Always use your crutches if you are to be  non-weight bearing. ? ?Do not take a shower. Baths are permissible as long as the foot is kept out of the water.  ? ?Every hour you are awake:  ?Bend your knee 15 times. ?Massage calf 15 times ? ?Call [facility] [phone]) if any of the following problems occur: ?You develop a temperature or fever. ?The bandage becomes saturated with blood. ?Medication does not stop your pain. ?Injury of the foot occurs. ?Any symptoms of infection including redness, odor, or red streaks running from wound.  ?

## 2021-07-30 ENCOUNTER — Other Ambulatory Visit: Payer: Self-pay

## 2021-07-30 ENCOUNTER — Ambulatory Visit: Payer: Commercial Managed Care - PPO | Admitting: Anesthesiology

## 2021-07-30 ENCOUNTER — Ambulatory Visit: Payer: Self-pay

## 2021-07-30 ENCOUNTER — Ambulatory Visit
Admission: RE | Admit: 2021-07-30 | Discharge: 2021-07-30 | Disposition: A | Discharge status: Home / Self Care | Payer: Commercial Managed Care - PPO | Source: Ambulatory Visit | Attending: Podiatry | Admitting: Podiatry

## 2021-07-30 ENCOUNTER — Encounter: Payer: Self-pay | Admitting: Podiatry

## 2021-07-30 ENCOUNTER — Encounter
Admission: RE | Disposition: A | Discharge status: Home / Self Care | Payer: Self-pay | Source: Ambulatory Visit | Attending: Podiatry

## 2021-07-30 DIAGNOSIS — M205X2 Other deformities of toe(s) (acquired), left foot: Secondary | ICD-10-CM | POA: Diagnosis not present

## 2021-07-30 DIAGNOSIS — M2041 Other hammer toe(s) (acquired), right foot: Secondary | ICD-10-CM | POA: Diagnosis not present

## 2021-07-30 DIAGNOSIS — Z09 Encounter for follow-up examination after completed treatment for conditions other than malignant neoplasm: Secondary | ICD-10-CM

## 2021-07-30 DIAGNOSIS — M2042 Other hammer toe(s) (acquired), left foot: Secondary | ICD-10-CM | POA: Diagnosis not present

## 2021-07-30 DIAGNOSIS — M2012 Hallux valgus (acquired), left foot: Secondary | ICD-10-CM | POA: Diagnosis present

## 2021-07-30 HISTORY — PX: WEIL OSTEOTOMY: SHX5044

## 2021-07-30 HISTORY — PX: BUNIONECTOMY: SHX129

## 2021-07-30 HISTORY — PX: CORRECTION OVERLAPPING TOES: SHX6615

## 2021-07-30 SURGERY — OSTEOTOMY, WEIL
Anesthesia: General | Site: Toe | Laterality: Left

## 2021-07-30 MED ORDER — DEXAMETHASONE SODIUM PHOSPHATE 4 MG/ML IJ SOLN
INTRAMUSCULAR | Status: DC | PRN
Start: 2021-07-30 — End: 2021-07-30
  Administered 2021-07-30: 4 mg via INTRAVENOUS

## 2021-07-30 MED ORDER — OXYCODONE HCL 5 MG/5ML PO SOLN: 5.0000 mg | Freq: Once | ORAL | Status: DC | PRN

## 2021-07-30 MED ORDER — OXYCODONE HCL 5 MG PO TABS: 5.0000 mg | ORAL_TABLET | Freq: Once | ORAL | Status: DC | PRN

## 2021-07-30 MED ORDER — 0.9 % SODIUM CHLORIDE (POUR BTL) OPTIME
TOPICAL | Status: DC | PRN
  Administered 2021-07-30: 11:00:00 1000 mL

## 2021-07-30 MED ORDER — AMOXICILLIN-POT CLAVULANATE 875-125 MG PO TABS: 1.0000 | ORAL_TABLET | Freq: Two times a day (BID) | ORAL | 0 refills | Status: AC

## 2021-07-30 MED ORDER — LIDOCAINE HCL (CARDIAC) PF 100 MG/5ML IV SOSY
PREFILLED_SYRINGE | INTRAVENOUS | Status: DC | PRN
  Administered 2021-07-30: 30 mg via INTRATRACHEAL

## 2021-07-30 MED ORDER — ONDANSETRON HCL 4 MG/2ML IJ SOLN
INTRAMUSCULAR | Status: DC | PRN
  Administered 2021-07-30: 4 mg via INTRAVENOUS

## 2021-07-30 MED ORDER — EPHEDRINE SULFATE (PRESSORS) 50 MG/ML IJ SOLN
INTRAMUSCULAR | Status: DC | PRN
  Administered 2021-07-30 (×3): 10 mg via INTRAVENOUS

## 2021-07-30 MED ORDER — FENTANYL CITRATE (PF) 100 MCG/2ML IJ SOLN
INTRAMUSCULAR | Status: DC | PRN
  Administered 2021-07-30: 100 ug via INTRAVENOUS

## 2021-07-30 MED ORDER — BUPIVACAINE LIPOSOME 1.3 % IJ SUSP
INTRAMUSCULAR | Status: DC | PRN
  Administered 2021-07-30: 20 mL via PERINEURAL

## 2021-07-30 MED ORDER — ONDANSETRON HCL 4 MG/2ML IJ SOLN: 4.0000 mg | Freq: Once | INTRAMUSCULAR | Status: DC | PRN

## 2021-07-30 MED ORDER — ACETAMINOPHEN 160 MG/5ML PO SOLN: 325.0000 mg | ORAL | Status: DC | PRN

## 2021-07-30 MED ORDER — PROPOFOL 10 MG/ML IV BOLUS
INTRAVENOUS | Status: DC | PRN
  Administered 2021-07-30: 30 mg via INTRAVENOUS
  Administered 2021-07-30: 120 mg via INTRAVENOUS

## 2021-07-30 MED ORDER — MIDAZOLAM HCL 2 MG/2ML IJ SOLN
INTRAMUSCULAR | Status: DC | PRN
  Administered 2021-07-30: 2 mg via INTRAVENOUS

## 2021-07-30 MED ORDER — LACTATED RINGERS IV SOLN: INTRAVENOUS | Status: DC

## 2021-07-30 MED ORDER — BUPIVACAINE HCL 0.5 % IJ SOLN
INTRAMUSCULAR | Status: DC | PRN
Start: 2021-07-30 — End: 2021-07-30
  Administered 2021-07-30: 10 mL via INTRA_ARTICULAR

## 2021-07-30 MED ORDER — ONDANSETRON HCL 4 MG PO TABS: 4.0000 mg | ORAL_TABLET | Freq: Three times a day (TID) | ORAL | 0 refills | Status: AC | PRN

## 2021-07-30 MED ORDER — FENTANYL CITRATE PF 50 MCG/ML IJ SOSY: 25.0000 ug | PREFILLED_SYRINGE | INTRAMUSCULAR | Status: DC | PRN

## 2021-07-30 MED ORDER — ASPIRIN EC 81 MG PO TBEC: 81.0000 mg | DELAYED_RELEASE_TABLET | Freq: Two times a day (BID) | ORAL | 0 refills | Status: DC

## 2021-07-30 MED ORDER — BUPIVACAINE HCL (PF) 0.5 % IJ SOLN
INTRAMUSCULAR | Status: DC | PRN
  Administered 2021-07-30: 20 mL via PERINEURAL

## 2021-07-30 MED ORDER — ASPIRIN EC 81 MG PO TBEC: 81.0000 mg | DELAYED_RELEASE_TABLET | Freq: Two times a day (BID) | ORAL | 0 refills | Status: AC

## 2021-07-30 MED ORDER — HYDROCODONE-ACETAMINOPHEN 7.5-325 MG PO TABS: 1.0000 | ORAL_TABLET | Freq: Four times a day (QID) | ORAL | 0 refills | Status: AC | PRN

## 2021-07-30 MED ORDER — ACETAMINOPHEN 325 MG PO TABS: 325.0000 mg | ORAL_TABLET | ORAL | Status: DC | PRN

## 2021-07-30 MED ORDER — CEFAZOLIN SODIUM-DEXTROSE 2-4 GM/100ML-% IV SOLN
2.0000 g | INTRAVENOUS | Status: AC
  Administered 2021-07-30: 07:00:00 2 g via INTRAVENOUS

## 2021-07-30 SURGICAL SUPPLY — 62 items
APL SKNCLS STERI-STRIP NONHPOA (GAUZE/BANDAGES/DRESSINGS) ×3
BENZOIN TINCTURE PRP APPL 2/3 (GAUZE/BANDAGES/DRESSINGS) ×1 IMPLANT
BIT DRILL 1.7 LNG CANN (DRILL) ×1 IMPLANT
BLADE OSC/SAGITTAL MD 9X18.5 (BLADE) ×1 IMPLANT
BLADE OSCILLATING/SAGITTAL (BLADE) ×4
BLADE SAW LAPIPLASTY 40X11 (BLADE) ×1 IMPLANT
BLADE SURG 15 STRL LF DISP TIS (BLADE) IMPLANT
BLADE SURG 15 STRL SS (BLADE) ×16
BLADE SW THK.38XMED LNG THN (BLADE) IMPLANT
BNDG CMPR STD VLCR NS LF 5.8X4 (GAUZE/BANDAGES/DRESSINGS) ×3
BNDG CMPR STD VLCR NS LF 5.8X6 (GAUZE/BANDAGES/DRESSINGS) ×3
BNDG COHESIVE 4X5 TAN ST LF (GAUZE/BANDAGES/DRESSINGS) ×4 IMPLANT
BNDG ELASTIC 4X5.8 VLCR NS LF (GAUZE/BANDAGES/DRESSINGS) ×4 IMPLANT
BNDG ELASTIC 6X5.8 VLCR NS LF (GAUZE/BANDAGES/DRESSINGS) ×4 IMPLANT
BNDG ESMARK 4X12 TAN STRL LF (GAUZE/BANDAGES/DRESSINGS) ×4 IMPLANT
BNDG GAUZE 1X2.1 STRL (MISCELLANEOUS) ×1 IMPLANT
BNDG GAUZE ELAST 4 BULKY (GAUZE/BANDAGES/DRESSINGS) ×4 IMPLANT
CANISTER SUCT 1200ML W/VALVE (MISCELLANEOUS) ×4 IMPLANT
CNTRSNK DRL 2 SCR (MISCELLANEOUS) IMPLANT
COUNTERSINK 2.0 (MISCELLANEOUS) ×4
COVER LIGHT HANDLE UNIVERSAL (MISCELLANEOUS) ×8 IMPLANT
CUFF TOURN SGL QUICK 18X4 (TOURNIQUET CUFF) IMPLANT
DRAPE BILATERAL LIMB T (DRAPES) ×1 IMPLANT
DRAPE FLUOR MINI C-ARM 54X84 (DRAPES) ×4 IMPLANT
DURAPREP 26ML APPLICATOR (WOUND CARE) ×5 IMPLANT
ELECT REM PT RETURN 9FT ADLT (ELECTROSURGICAL) ×4
ELECTRODE REM PT RTRN 9FT ADLT (ELECTROSURGICAL) ×3 IMPLANT
GAUZE SPONGE 4X4 12PLY STRL (GAUZE/BANDAGES/DRESSINGS) ×5 IMPLANT
GAUZE XEROFORM 1X8 LF (GAUZE/BANDAGES/DRESSINGS) ×5 IMPLANT
GLOVE SURG POLYISO LF SZ7 (GLOVE) ×8 IMPLANT
GLOVE SURG UNDER POLY LF SZ7 (GLOVE) ×4 IMPLANT
GOWN STRL REUS W/ TWL LRG LVL3 (GOWN DISPOSABLE) ×6 IMPLANT
GOWN STRL REUS W/TWL LRG LVL3 (GOWN DISPOSABLE) ×8
K-WIRE DBL END TROCAR 6X.045 (WIRE) ×4
KIT TURNOVER KIT A (KITS) ×4 IMPLANT
KWIRE DBL END TROCAR 6X.045 (WIRE) IMPLANT
LAPIPLASTY SYS 4A (Orthopedic Implant) ×4 IMPLANT
NS IRRIG 500ML POUR BTL (IV SOLUTION) ×4 IMPLANT
PACK EXTREMITY ARMC (MISCELLANEOUS) ×4 IMPLANT
PADDING CAST BLEND 4X4 NS (MISCELLANEOUS) ×12 IMPLANT
RASP SM TEAR CROSS CUT (RASP) ×1 IMPLANT
SCREW 2.7 HIGH PITCH LOCKING (Screw) ×1 IMPLANT
SCREW CANN HEAD ST 2.0X10 (Screw) ×2 IMPLANT
SCREW CANN SHORT THRD 2.0X9 (Screw) ×1 IMPLANT
SCREW HEAD ST 12X2XHD NS (Screw) IMPLANT
SCREW HEADED 2.0X12 (Screw) ×4 IMPLANT
SCREW HIGH PITCH LOCK 2.7 (Screw) ×1 IMPLANT
SPLINT CAST 1 STEP 4X30 (MISCELLANEOUS) ×4 IMPLANT
STOCKINETTE IMPERVIOUS LG (DRAPES) ×5 IMPLANT
STRIP CLOSURE SKIN 1/4X4 (GAUZE/BANDAGES/DRESSINGS) ×1 IMPLANT
SUT ETHILON 4-0 (SUTURE) ×4
SUT ETHILON 4-0 FS2 18XMFL BLK (SUTURE) ×3
SUT MNCRL 4-0 (SUTURE) ×12
SUT MNCRL 4-0 27XMFL (SUTURE) ×9
SUT VIC AB 3-0 SH 27 (SUTURE) ×12
SUT VIC AB 3-0 SH 27X BRD (SUTURE) IMPLANT
SUT VIC AB 4-0 SH 27 (SUTURE) ×8
SUT VIC AB 4-0 SH 27XANBCTRL (SUTURE) IMPLANT
SUTURE ETHLN 4-0 FS2 18XMF BLK (SUTURE) IMPLANT
SUTURE MNCRL 4-0 27XMF (SUTURE) IMPLANT
SYSTEM LAPIPLASTY 4A (Orthopedic Implant) IMPLANT
WIRE SMOOTH TROCAR .9MMX150MML (WIRE) ×4 IMPLANT

## 2021-07-30 NOTE — Transfer of Care (Signed)
Immediate Anesthesia Transfer of Care Note ? ?Patient: [name] ? ?Procedure(s) Performed: WEIL OSTEOTOMY X 2 SECOND AND FIFTH (Left: Foot) ?BUNIONECTOMY LAPIDUS-TYPE + AKIN (Left: Toe) ?CORRECTION OVERLAPPING TOES 2ND TOES BIL (Bilateral: Foot) ? ?Patient Location: PACU ? ?Anesthesia Type: General LMA ? ?Level of Consciousness: awake, alert  and patient cooperative ? ?Airway and Oxygen Therapy: Patient Spontanous Breathing and Patient connected to supplemental oxygen ? ?Post-op Assessment: Post-op Vital signs reviewed, Patient's Cardiovascular Status Stable, Respiratory Function Stable, Patent Airway and No signs of Nausea or vomiting ? ?Post-op Vital Signs: Reviewed and stable ? ?Complications: No notable events documented. ? ?

## 2021-07-30 NOTE — H&P (Signed)
HISTORY AND PHYSICAL INTERVAL NOTE: ? ?07/30/2021 ? ?7:11 AM ? ?Nicole Choi  has presented today for surgery, with the diagnosis of M20.12 - Hallux Valgus ?M20.41, M20.42 - Hammertoes of both feet ?M20.5X2 - Overlapping toe ?Q66.6 - 5th med abd valgus.  The various methods of treatment have been discussed with the patient.  No guarantees were given.  After consideration of risks, benefits and other options for treatment, the patient has consented to surgery.  I have reviewed the patients? chart and labs.   ? ?PROCEDURE: ?RIGHT 2ND HAMMER TOE CORRECTION, FLEXOR TENDON TRANSFER ?LEFT LAPIDUS BUNIONECTOMY WITH AKIN OSTEOTOMY ?LEFT 2ND METATARSAL WEIL OSTEOTOMY ?LEFT 2ND HAMMER TOE CORRECTION, FLEXOR TENDON TRANSFER ?LEFT TAILORS BUNIONECTOMY WITH OSTEOTOMY ? ? ?A history and physical examination was performed in my office.  The patient was reexamined.  There have been no changes to this history and physical examination. ? ?Rosetta Posner, DPM ? ?

## 2021-07-30 NOTE — Anesthesia Preprocedure Evaluation (Signed)
Anesthesia Evaluation  ?Patient identified by MRN, date of birth, ID band ?Patient awake ? ? ? ?Reviewed: ?Allergy & Precautions, H&P , NPO status , Patient's Chart, lab work & pertinent test results ? ?Airway ?Mallampati: II ? ?TM Distance: >3 FB ?Neck ROM: full ? ? ? Dental ?no notable dental hx. ? ?  ?Pulmonary ? ?  ?Pulmonary exam normal ?breath sounds clear to auscultation ? ? ? ? ? ? Cardiovascular ?Normal cardiovascular exam ?Rhythm:regular Rate:Normal ? ? ?  ?Neuro/Psych ?  ? GI/Hepatic ?  ?Endo/Other  ? ? Renal/GU ?  ? ?  ?Musculoskeletal ? ? Abdominal ?  ?Peds ? Hematology ?  ?Anesthesia Other Findings ? ? Reproductive/Obstetrics ? ?  ? ? ? ? ? ? ? ? ? ? ? ? ? ?  ?  ? ? ? ? ? ? ? ? ?Anesthesia Physical ?Anesthesia Plan ? ?ASA: 2 ? ?Anesthesia Plan: General LMA  ? ?Post-op Pain Management: Minimal or no pain anticipated and Regional block  ? ?Induction:  ? ?PONV Risk Score and Plan: 3 and Treatment may vary due to age or medical condition, Ondansetron, Dexamethasone and Midazolam ? ?Airway Management Planned:  ? ?Additional Equipment:  ? ?Intra-op Plan:  ? ?Post-operative Plan:  ? ?Informed Consent: I have reviewed the patients History and Physical, chart, labs and discussed the procedure including the risks, benefits and alternatives for the proposed anesthesia with the patient or authorized representative who has indicated his/her understanding and acceptance.  ? ? ? ?Dental Advisory Given ? ?Plan Discussed with: CRNA ? ?Anesthesia Plan Comments:   ? ? ? ? ? ? ?Anesthesia Quick Evaluation ? ?

## 2021-07-30 NOTE — Op Note (Signed)
PODIATRY / FOOT AND ANKLE SURGERY OPERATIVE REPORT    SURGEON: Rosetta Posner, DPM  PRE-OPERATIVE DIAGNOSIS:  1.  Right second hammertoe 2.  Left hallux limitus 3.  Left hallux valgus with hypermobile first ray and elevated first metatarsal 4.  Left second hammertoe 5.  Left long plantarflexed second metatarsal 6.  Left tailor's bunion  POST-OPERATIVE DIAGNOSIS: Same  PROCEDURE(S): Second hammertoe repair, flexor tendon transfer bilateral Left Lapidus bunionectomy Left second metatarsal Weil osteotomy Left tailor's bunionectomy with osteotomy  HEMOSTASIS: Ankle tourniquets bilateral, 35 minutes right, 125 minutes left  ANESTHESIA: general  ESTIMATED BLOOD LOSS: 30 cc  FINDING(S): 1.  Mild articular cartilage damage to the left first metatarsal phalangeal joint  PATHOLOGY/SPECIMEN(S): None  INDICATIONS:   Nicole Choi is a 66 y.o. female who presents with pain to both second toes bilaterally with elevation of the second toes bilaterally such that the second toe does not touch the floor with weightbearing.  Patient also complains of pain to the first metatarsal phalangeal joint of the left foot as well as tailor's bunion area to the left foot.  Patient has exhausted conservative measures and presents today for surgical intervention.  All treatment options were discussed with the patient both conservative and surgical attempts at correction include potential risks and complications at this time patient is elected for surgical intervention described above.  No guarantees given.  Consent obtained prior to procedure.  DESCRIPTION: After obtaining full informed written consent, the patient was brought back to the operating room and placed supine upon the operating table.  The patient received IV antibiotics prior to induction.  Preoperatively anesthesia performed a popliteal and saphenous nerve block to the left lower extremity.  After obtaining adequate anesthesia, a preoperative  block was performed to the right foot at the second ray with 10 cc of half percent Marcaine plain.  The patient was prepped and draped in the standard fashion.  The right lower extremity was exsanguinated and the pneumatic ankle tourniquet was inflated.  Attention was then directed to the second metatarsal phalangeal joint and the second toe where a linear longitudinal incision was made over the second metatarsal phalangeal joint to the middle phalanx area.  The incision was deepened to the subcutaneous tissues utilizing sharp and blunt dissection and care was taken to identify and retract all vital neurovascular structures all venous contributories were cauterized as necessary.  At this time an extensor tendon lengthening was performed in a Z-type manner and reinforced with 3-0 Vicryl while holding the toe in a rectus position.  The capsule at the dorsal aspect of the second metatarsal phalangeal joint was also released.  Creating a dorsal capsulotomy to try to reduce some of the dorsal contracture of the toe.  The toe still appeared to be sitting in a slightly dorsiflexed position with the foot loaded.  At this time dissection was then continued around the medial aspect of the second digit to the plantar surface near the middle phalanx and PIPJ.  At this time the flexor tendon sheath was identified and transected and the flexor digitorum longus tendon was identified and cut as far distally as possible.  Slight dissection was performed underneath the extensor tendon on the top of the foot at the second toe at the proximal phalanx base.  The flexor tendon was then brought underneath the extensor tendon and the toe was held in a rectus position to slightly plantarflexed.  A hemostat was used to hold the repair.  3-0 Vicryl was then  used in over and over stitching x3 over the area to secure the extensor tendon to the flexor tendon while holding the toe in a rectus position to slight plantarflexion.  The excess  flexor tendon was cut and passed off the operative site.  The surgical site was flushed with copious amounts normal sterile saline.  The subcutaneous tissue was reapproximated well coapted with 4-0 Vicryl, skin was reapproximated well coapted with 4-0 nylon in a combination of simple and horizontal mattress type stitching.  The pneumatic ankle tourniquet was deflated and a prompt hyperemic response was noted all digits of the right foot.  A postoperative dressing was applied consisting of Xeroform followed by 4 x 4 gauze, Kling, Kerlix, Coban.  Esmarch bandage used to exsanguinate the left lower extremity and the pneumatic ankle tourniquet was inflated.  Attention was directed to the dorsal aspect of the first tarsometatarsal joint where a linear longitudinal incision was made slightly medial to the tendon of the extensor hallucis longus.  The incision was deepened through the subcutaneous tissues utilizing sharp and blunt dissection and care was taken to identify and retract all vital neural and vascular structures all venous contributories were cauterized as necessary.  An incision was made medial to the tendon of the extensor hallucis longus into the capsular and periosteal tissue in the extensor hallucis longus tendon was retracted throughout the remainder the case.  The periosteal and capsular tissue was reflected medially and laterally thereby exposing the first tarsometatarsal joint at the operative site.  Attention was then directed to the first metatarsal phalangeal joint of the left foot.  A linear longitudinal incision was made along the contour of the deformity medial to the tendon of the extensor hallucis longus.  The incision was deepened through the subcutaneous tissues utilizing sharp blunt dissection and care was taken to identify and retract all vital neural and vascular structures and all venous contributories were cauterized as necessary.  A capsular and periosteal incision was made medial to  the tendon of the extensor hallucis longus involve the contour of the deformity.  The capsular and periosteal tissue was reflected medially and laterally thereby exposing the first metatarsal head at the operative site.  Attention was directed to the first interspace via the same incision where the extensor tendon was retracted medially and the skin and subcutaneous tissue was retracted laterally.  Blunt dissection was continued down into the first interspace at the first metatarsal phalangeal joint level and using a 15 blade a release of the lateral collateral and suspensory ligaments was performed as well as the conjoined tendon of the adductor hallucis.  This completed the lateral release.  Attention was then directed back to the medial aspect of the first metatarsal head where a sagittal bone saw was used to resect the medial eminence.  There also appeared to be a relatively large dorsal exostosis which was resected and passed off the operative site along with the medial eminence.  There did appear to be some mild articular cartilage damage centrally and at the dorsal aspect of the joint.  Some of this was resected when removing the dorsal bone spur but still some remain centrally within the joint.  The proximal phalanx base did not appear to have any articular cartilage disease.  A 0.045 K wire was used to drill into these areas of cartilage damage to try to produce fibrocartilage to the areas.  Attention was then directed back to the first tarsometatarsal joint of the left foot.  An osteotome  was placed into the first tarsometatarsal joint to free up any of the plantar ligamentous structures.  A sagittal bone saw was also placed into the first tarsometatarsal joint and ran dorsal to plantar to free up any adhesions were loose tissue from the area to mobilize the joint.  The fulcrum was then placed between the first and second metatarsals.  The wire for rotational purposes was placed into the first metatarsal  base.  Under fluoroscopic guidance the foot was then held in a reduced position reducing the first intermetatarsal space angle and reducing the rotation of the sesamoid such that the sesamoids were underneath the first metatarsal head.  Once this appeared to be achieved the reduction apparatus was then held up to the foot and a small percutaneous incision was made lateral to the midshaft portion or proximal portion of the shaft of the second metatarsal.  Blunt dissection was continued down with a hemostat to this area after a small skin incision was made.  After the reduction device was then placed onto the foot the pin that was used for rotational purposes was used to hold the foot in a rectus position while holding manual reduction.  Once this was obtained and the first ray was also held in a plantarflexed position and the external jig device was tightened to hold the reduction in place.  C-arm was utilized to verify correct position which appeared to be excellent as the first intermetatarsal space appeared to be reduced within normal limits and the sesamoids appear to be directly underneath the first metatarsal head.  At this time the guide instrument was placed into the joint and the guide was placed over the top of this instrumentation and was then temporary fixated with wires.  Fluoroscopic guidance was used to assess the position of the guide.  Once it appeared to be in adequate position dorsal to plantar cuts were made across the distal aspect of the medial cuneiform and proximal aspect of the first metatarsal base removing the articular cartilage surfaces.  The guide was removed as well as one of the temporary pins.  The joint distractor was then applied and the external jig was removed.  The fulcrum was also removed.  The articular surfaces of the medial cuneiform and first metatarsal base were resected and passed off in the operative site.  A flush was performed with copious amounts normal sterile saline.   The fenestrated drill bit was then used to fenestrate the joint surfaces and the joint compressor device was then applied.  Once this was applied temporary fixation was obtained with 2 K wires and a threaded K wire.  Once again fluoroscopic guidance was used to verify correct position which appeared to be excellent.  At this time the dorsal plate was then placed using the lapiplasty system utilizing all 4 locking screws.  The medial plate was then also applied using all 4 locking screws.  The plates appeared to be of the appropriate placement in size under fluoroscopic guidance.  The correction appeared to be well maintained.  The surgical site was flushed with copious amounts normal sterile saline.  The periosteal and capsular tissues were reapproximated well coapted at both the first tarsometatarsal joint and first metatarsal phalangeal joint with 3-0 Vicryl.  The subcutaneous tissue was reapproximated well coapted with 4-0 Vicryl.  The skin was then reapproximated well coapted in a subcuticular nature with 4-0 Monocryl.  Attention was then directed to the second metatarsal phalangeal joint of the left foot and second  toe where a linear longitudinal incision was made over the joint area.  The incision was made and deepened down to the subcutaneous tissues where electrocauterization was used to cauterize any bleeding vessels or crossing vessels to this area.  All neurovascular structures were retracted.  A capsular and periosteal incision was made medial to the tendon of the extensor digitorum longus.  The capsular and periosteal tissue was reflected medially and laterally thereby exposing the second metatarsal phalangeal joint at the operative site.  A sagittal bone saw was then used to create a distal second metatarsal Weil osteotomy.  The metatarsal was shortened approximately 4 to 5 mm to realign the parabola position.  This was checked under fluoroscopic guidance and held with temporary fixation K wires  from [employer].  One 2.0 x 10 mm and one 2.0 x 12 mm headed cannulated screw was then placed from dorsal to plantar across the temporary wires that were held in place with excellent compression.  The guidewires were removed and passed off in the operative site.  C-arm was utilized to verify correct position which appeared to be excellent.  There appeared to be good compression across the osteotomy site.  The dorsal overhang was resected with a rongeur and passed off the operative site.  Dissection was then continued distally to the second digit to the middle phalanx level.  Dissection was then continued laterally and plantarly underneath the second toe.  The flexor tendon sheath was identified and transected and the flexor digitorum longus tendon was identified.  A bit of dorsal dissection was then performed underneath the extensor tendon at the second proximal phalanx base.  The flexor tendon was transected as distally as possible and passed from plantar to dorsal underneath the extensor tendon.  The toe was then held in a slightly plantarflexed and lateral position and the flexor tendon was held taut and clamped with a hemostat.  3-0 Vicryl was then used to suture the extensor tendon to the flexor tendon completing the flexor tendon transfer.  Followed bit of excess tendon from the flexor tendon was excised and passed off the operative site.  The toe appeared to sit in a rectus position with the foot loaded to slight plantarflexion.  The surgical site was flushed with copious amounts normal sterile saline.  The capsular and periosteal tissues were reapproximated well coapted with 3-0 Vicryl.  The subcutaneous tissue was reapproximated well coapted with 4-0 Vicryl.  The skin was then reapproximated well coapted with subcuticular stitching with 4-0 Monocryl.  Attention was then directed to the left fifth metatarsal phalangeal joint where an incision was made following the contour of the deformity of the  tailor's bunion.  The incision was deepened to the subcutaneous tissues utilizing sharp and blunt dissection and care was taken to identify and retract all vital neurovascular structures and all venous contributories were cauterized as necessary.  A capsular and periosteal incision was made lateral to the tendon of the extensor digitorum longus involved the contour of the deformity.  The capsular and periosteal tissue was reflected medially and laterally thereby exposing the fifth metatarsal head at the operative site.  The lateral eminence was resected and passed off in the operative site and smoothed with a paddle rasp and rongeur.  A Weil type osteotomy was then performed to the fifth metatarsal head.  The capital fragment was shifted medially and rotated to reduce the lateral deviation angle and held in a rectus position.  Temporary K wire was then placed from  dorsal to plantar across the osteotomy site.  Fluoroscopic guidance was used to visualize the correction which appeared to be excellent as the fourth intermetatarsal space angle appeared to be within normal limits as well as lateral deviation angle.  One 2.0 x 10 mm and one 2.0 x 9 mm partially-threaded cannulated screw was placed from dorsal plantar across the osteotomy sites over the temporary K wires, [employer].  Excellent compression was noted.  The guidewire was removed and passed off the operative site.  The lateral overhang was then resected and passed off the operative site.  Final C-arm imaging was then taken showing correction of the first metatarsal phalangeal joint and compression at the first tarsometatarsal joint.  The first intermetatarsal space angle appeared to be within normal limits and the screws and plates appear to be of the appropriate size and length, the first metatarsal appeared to be rotated appropriately such that the sesamoids are underneath the first metatarsal head.  The parabola position appeared to be within normal limits.   Excellent compression appeared to be across the second and fifth metatarsal osteotomy sites.  The lateral deviation angle and intermetatarsal space angle appeared to be within normal limits.  The second toe appeared to sit in a rectus position.  The surgical site was once again flushed with copious amounts normal sterile saline.  During suturing of this the pneumatic ankle tourniquet was deflated and a prompt hyperemic response was noted all digits of the left foot.  The Periosteal Tissues Were Reapproximated Well Coapted with 3-0 Vicryl.  The Subcutaneous Tissue Was Reapproximated Well Coapted with 4-0 Vicryl.  The Skin Was Then Reapproximated Well Coapted with 4-0 Monocryl in Subcuticular Stitching.  Postoperative dressing was then applied to the left foot consisting of Xeroform to the incision lines followed by 4 x 4 gauze, Kling, Kerlix, Webril, posterior splint, Ace wrap.  The patient tolerated the procedure and anesthesia well was transferred to recovery room vital signs stable vascular status intact all toes of both feet.  Following.  Postoperative monitoring the patient be discharged home with the appropriate orders, follow-up instructions, medications and patient is to remain nonweightbearing at all times left lower extremity and partial weightbearing with heel contact and surgical shoe to the right foot.  Patient should have follow-up within 1 week of discharge date.  COMPLICATIONS: None  CONDITION: Good, stable  Rosetta Posner, DPM

## 2021-07-30 NOTE — Anesthesia Procedure Notes (Signed)
Anesthesia Regional Block: Adductor canal block  ? ?Pre-Anesthetic Checklist: , timeout performed,  Correct Patient, Correct Site, Correct Laterality,  Correct Procedure, Correct Position, site marked,  Risks and benefits discussed,  Surgical consent,  Pre-op evaluation,  At surgeon's request and post-op pain management ? ?Laterality: Left ? ?Prep: chloraprep     ?  ?Needles:  ?Injection technique: Single-shot ? ?Needle Type: Echogenic Needle   ? ? ?Needle Length: 9cm  ?Needle Gauge: 21  ? ? ? ?Additional Needles: ? ? ?Procedures:,,,, ultrasound used (permanent image in chart),,    ?Narrative:  ?Start time: 07/30/2021 7:05 AM ?End time: 07/30/2021 7:12 AM ?Injection made incrementally with aspirations every 5 mL. ? ?Performed by: Personally  ?Anesthesiologist: Ranee Gosselin, MD ? ?Additional Notes: ?Functioning IV was confirmed and monitors applied. Ultrasound guidance: relevant anatomy identified, needle position confirmed, local anesthetic spread visualized around nerve(s)., vascular puncture avoided.  Image printed for medical record.  Negative aspiration and no paresthesias; incremental administration of local anesthetic. The patient tolerated the procedure well. Vitals signes recorded in RN notes.  5 ml exparel, 5 ml 0.5% Bupi ? ? ? ?

## 2021-07-30 NOTE — Anesthesia Procedure Notes (Signed)
Procedure Name: LMA Insertion ?Date/Time: 07/30/2021 7:39 AM ?Performed by: Maree Krabbe, CRNA ?Pre-anesthesia Checklist: Patient identified, Emergency Drugs available, Suction available, Timeout performed and Patient being monitored ?Patient Re-evaluated:Patient Re-evaluated prior to induction ?Oxygen Delivery Method: Circle system utilized ?Preoxygenation: Pre-oxygenation with 100% oxygen ?Induction Type: IV induction ?LMA: LMA inserted ?LMA Size: 4.0 ?Number of attempts: 1 ?Placement Confirmation: positive ETCO2 and breath sounds checked- equal and bilateral ?Tube secured with: Tape ?Dental Injury: Teeth and Oropharynx as per pre-operative assessment  ? ? ? ? ?

## 2021-07-30 NOTE — Progress Notes (Signed)
Assisted Mike Stella ANMD with left, ultrasound guided, popliteal/saphenous block. Side rails up, monitors on throughout procedure. See vital signs in flow sheet. Tolerated Procedure well.  

## 2021-07-30 NOTE — Anesthesia Postprocedure Evaluation (Signed)
Anesthesia Post Note ? ?Patient: Nicole Choi ? ?Procedure(s) Performed: WEIL OSTEOTOMY X 2 SECOND AND FIFTH (Left: Foot) ?BUNIONECTOMY LAPIDUS-TYPE + AKIN (Left: Toe) ?CORRECTION OVERLAPPING TOES 2ND TOES BIL (Bilateral: Foot) ? ? ?  ?Patient location during evaluation: PACU ?Anesthesia Type: General ?Level of consciousness: awake and alert and oriented ?Pain management: satisfactory to patient ?Vital Signs Assessment: post-procedure vital signs reviewed and stable ?Respiratory status: spontaneous breathing, nonlabored ventilation and respiratory function stable ?Cardiovascular status: blood pressure returned to baseline and stable ?Postop Assessment: Adequate PO intake and No signs of nausea or vomiting ?Anesthetic complications: no ? ? ?No notable events documented. ? ?[place] ? ? ? ? ? ?

## 2021-07-30 NOTE — Anesthesia Procedure Notes (Signed)
Anesthesia Regional Block: Popliteal block  ? ?Pre-Anesthetic Checklist: , timeout performed,  Correct Patient, Correct Site, Correct Laterality,  Correct Procedure, Correct Position, site marked,  Risks and benefits discussed,  Surgical consent,  Pre-op evaluation,  At surgeon's request and post-op pain management ? ?Laterality: Left ? ?Prep: chloraprep     ?  ?Needles:  ?Injection technique: Single-shot ? ?Needle Type: Echogenic Needle   ? ? ?Needle Length: 9cm  ?Needle Gauge: 21  ? ? ? ?Additional Needles: ? ? ?Procedures:,,,, ultrasound used (permanent image in chart),,    ?Narrative:  ?Start time: 07/30/2021 7:05 AM ?End time: 07/30/2021 7:12 AM ?Injection made incrementally with aspirations every 5 mL. ? ?Performed by: Personally  ?Anesthesiologist: Ronelle Nigh, MD ? ?Additional Notes: ?Functioning IV was confirmed and monitors applied. Ultrasound guidance: relevant anatomy identified, needle position confirmed, local anesthetic spread visualized around nerve(s)., vascular puncture avoided.  Image printed for medical record.  Negative aspiration and no paresthesias; incremental administration of local anesthetic. The patient tolerated the procedure well. Vitals signes recorded in RN notes.  15 ml exparel,  15 ml 0.5% Bup ? ? ? ?

## 2021-07-31 ENCOUNTER — Encounter: Payer: Self-pay | Admitting: Podiatry

## 2022-03-22 ENCOUNTER — Encounter (INDEPENDENT_AMBULATORY_CARE_PROVIDER_SITE_OTHER): Payer: Self-pay

## 2023-02-21 IMAGING — US US EXTREM LOW VENOUS*R*
1 series · 14 of 24 positions shown · non-contrast
Comparison: None.

CLINICAL DATA: Right lower leg swelling.

EXAM:
RIGHT LOWER EXTREMITY VENOUS DOPPLER ULTRASOUND
TECHNIQUE: Gray-scale sonography with compression, as well as color and duplex
ultrasound, were performed to evaluate the deep venous system(s)
from the level of the common femoral vein through the popliteal and
proximal calf veins.

[Series 1: us extrem low venous*right* · 0.06mm/px · 14 of 36 slices shown]
[im 1/36]
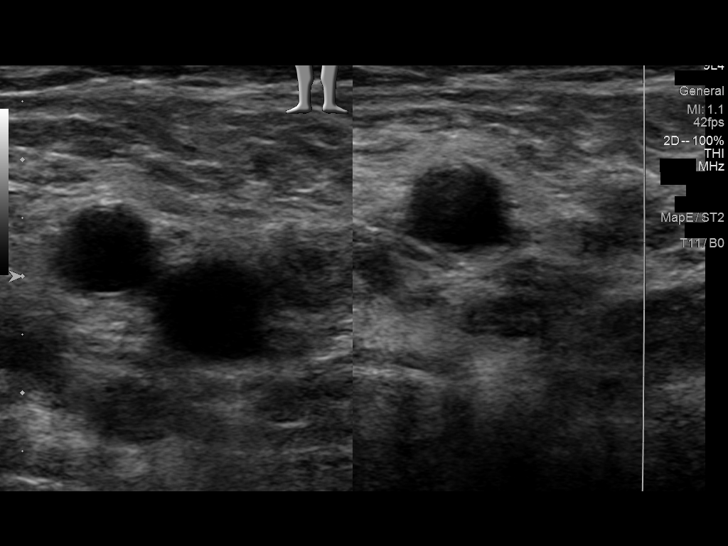
[im 4/36]
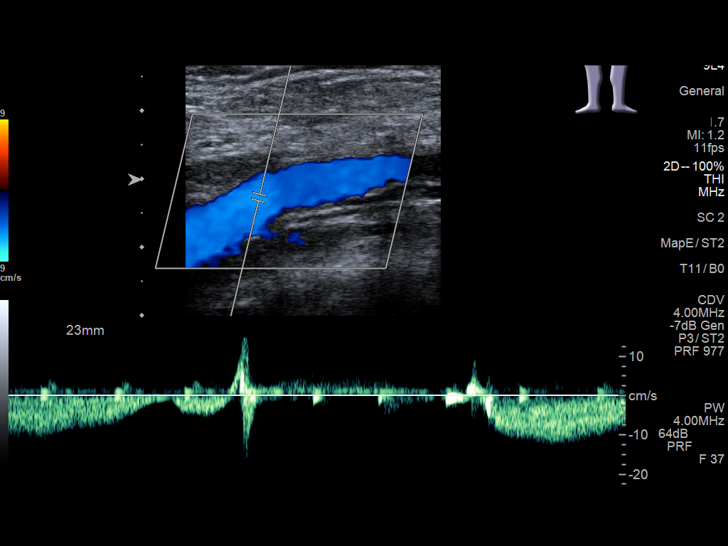
[im 7/36]
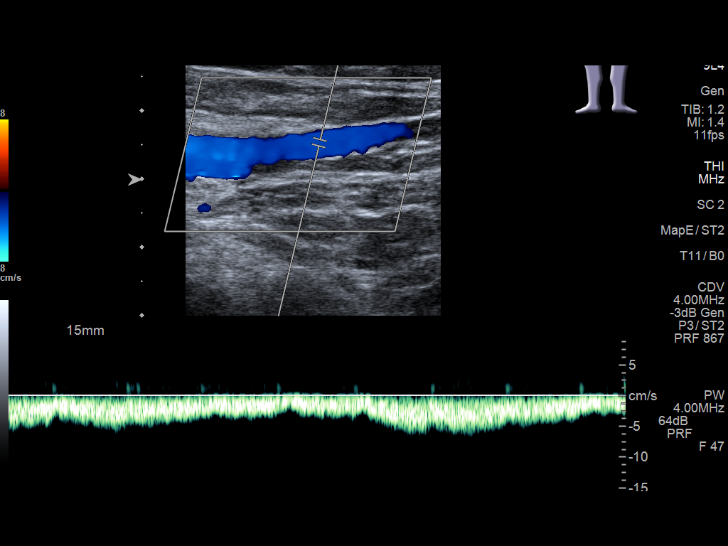
[im 10/36]
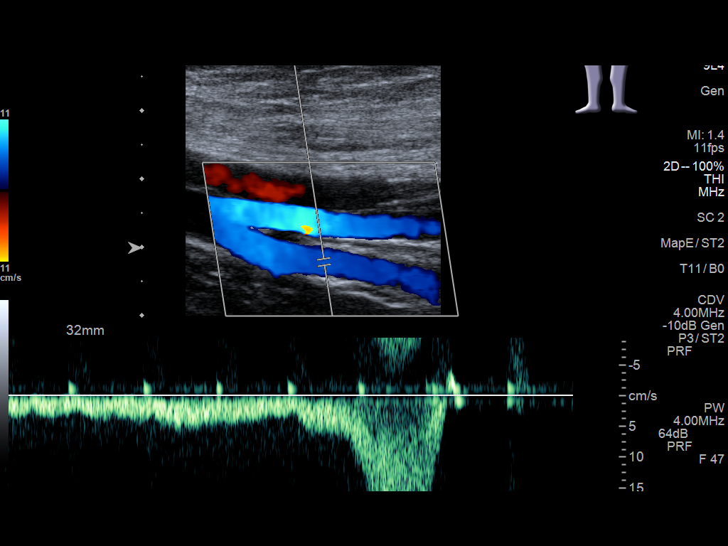
[im 11/36]
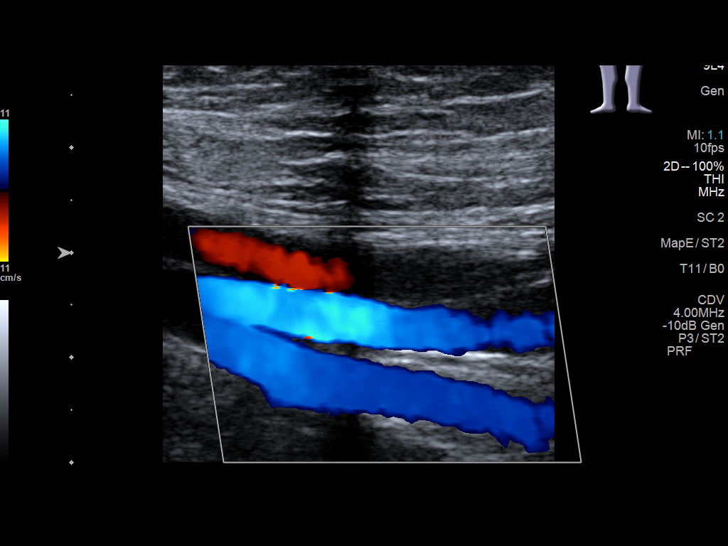
[im 14/36]
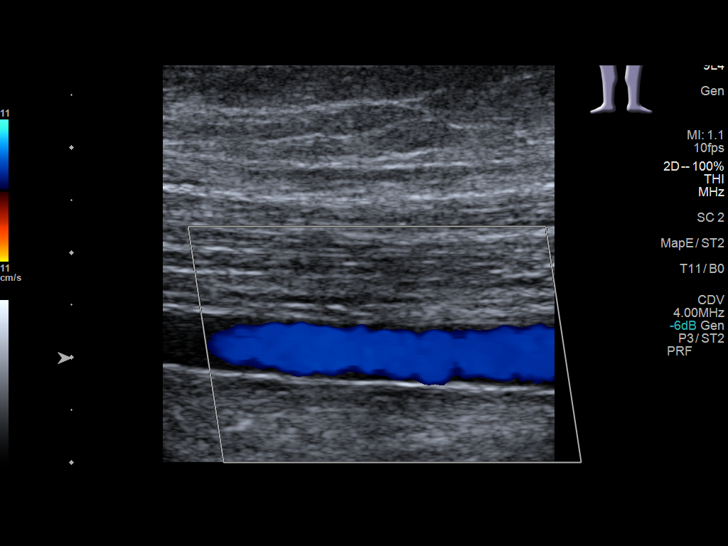
[im 17/36]
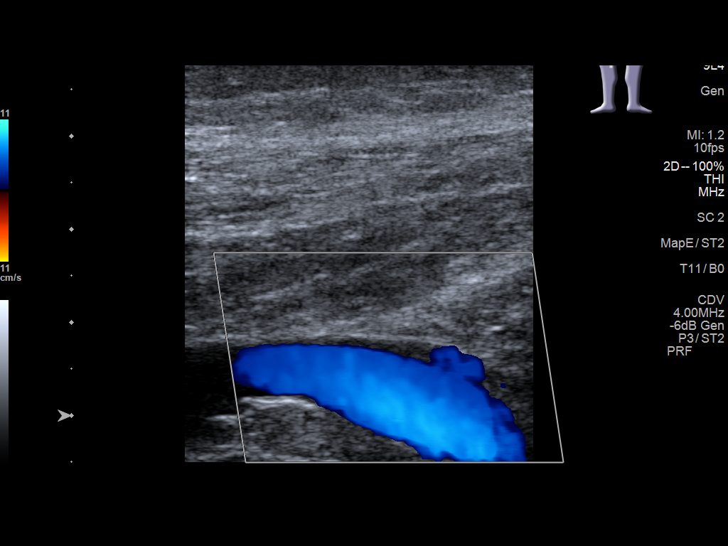
[im 19/36]
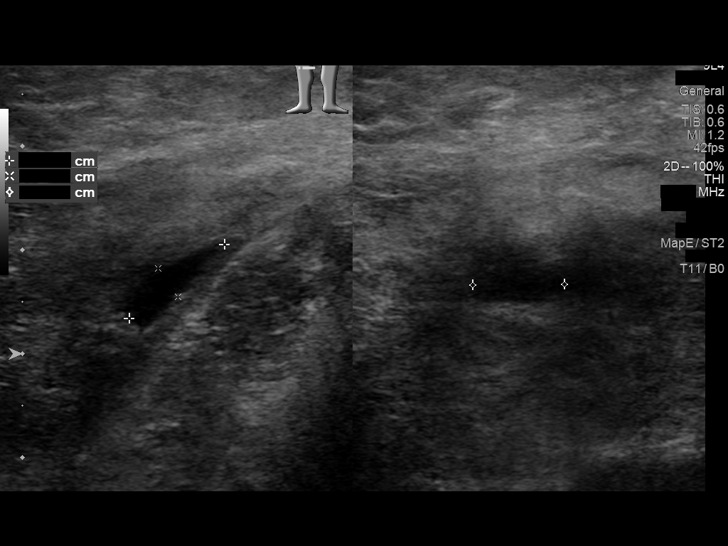
[im 22/36]
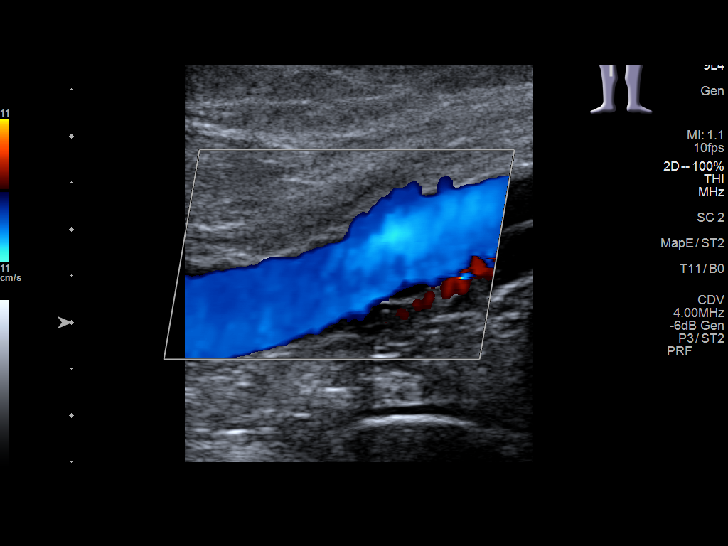
[im 25/36]
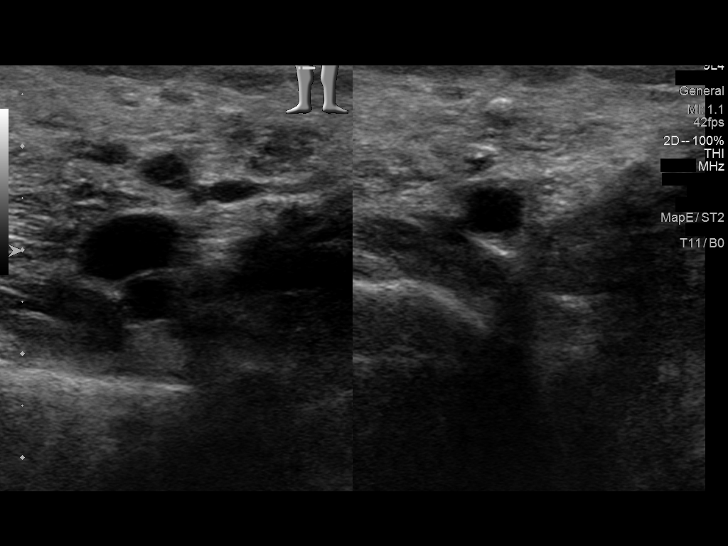
[im 28/36]
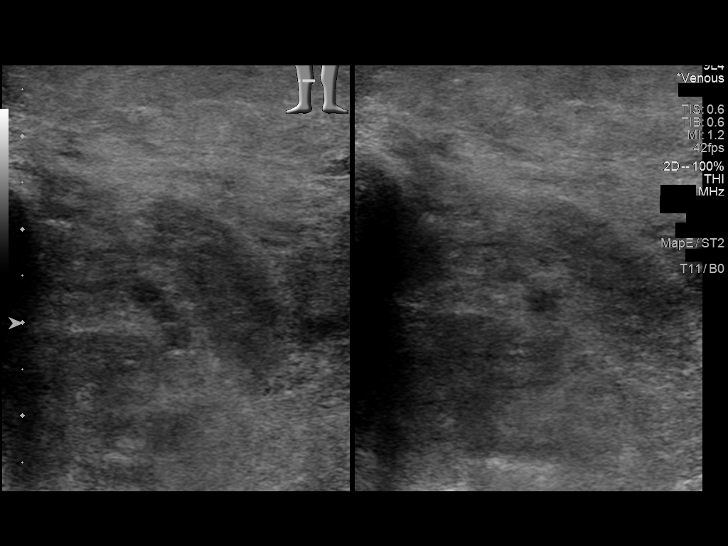
[im 29/36]
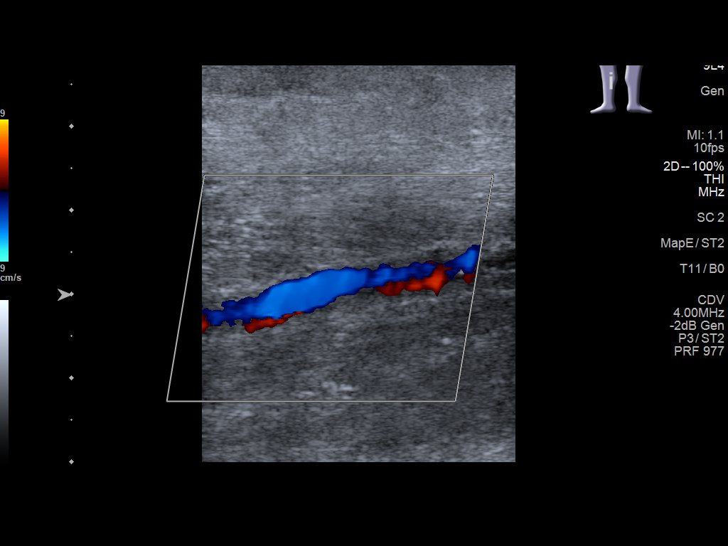
[im 32/36]
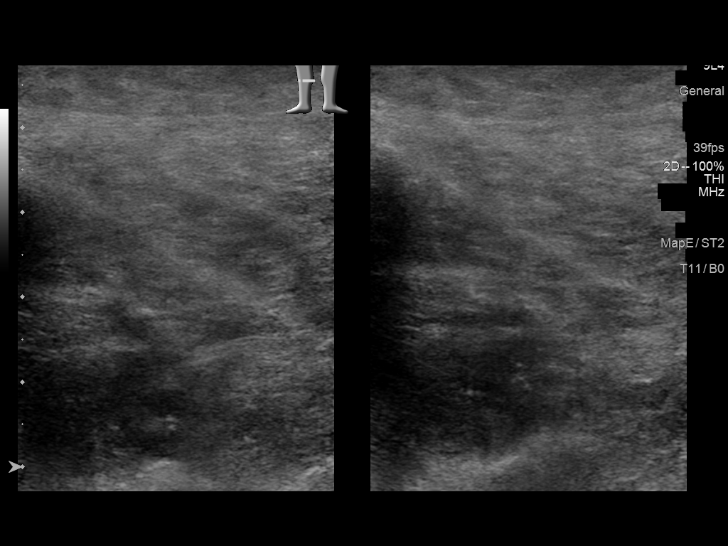
[im 36/36]
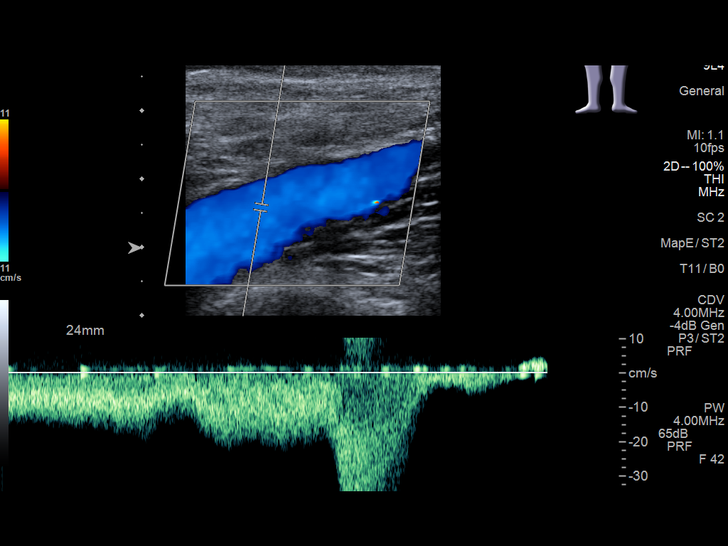

[14 of 24 positions shown; findings below may reference images not displayed]

FINDINGS: VENOUS

Normal compressibility of the common femoral, superficial femoral,
and popliteal veins, as well as the visualized calf veins.
Visualized portions of profunda femoral vein and great saphenous
vein unremarkable. No filling defects to suggest DVT on grayscale or
color Doppler imaging. Doppler waveforms show normal direction of
venous flow, normal respiratory plasticity and response to
augmentation.

Limited views of the contralateral common femoral vein are
unremarkable.

OTHER

1.2 x 0.3 x 0.9 cm Baker cyst.

Limitations: None.
IMPRESSION: 1. No evidence of deep venous thrombosis.
2. Tiny Baker cyst.
